# Patient Record
Sex: Male | Born: 1955 | Race: Black or African American | Hispanic: No | Marital: Single | Smoking: Former smoker
Health system: Southern US, Community
[De-identification: ages and names within clinical notes are randomized; demographics above are authoritative.]

## PROBLEM LIST (undated history)

## (undated) DIAGNOSIS — R1310 Dysphagia, unspecified: Secondary | ICD-10-CM

## (undated) DIAGNOSIS — R188 Other ascites: Secondary | ICD-10-CM

## (undated) DIAGNOSIS — R042 Hemoptysis: Secondary | ICD-10-CM

## (undated) DIAGNOSIS — J449 Chronic obstructive pulmonary disease, unspecified: Secondary | ICD-10-CM

## (undated) DIAGNOSIS — I739 Peripheral vascular disease, unspecified: Secondary | ICD-10-CM

## (undated) DIAGNOSIS — I4891 Unspecified atrial fibrillation: Secondary | ICD-10-CM

## (undated) DIAGNOSIS — F0390 Unspecified dementia without behavioral disturbance: Secondary | ICD-10-CM

## (undated) DIAGNOSIS — K219 Gastro-esophageal reflux disease without esophagitis: Secondary | ICD-10-CM

## (undated) DIAGNOSIS — K7460 Unspecified cirrhosis of liver: Secondary | ICD-10-CM

## (undated) DIAGNOSIS — M109 Gout, unspecified: Secondary | ICD-10-CM

## (undated) DIAGNOSIS — E119 Type 2 diabetes mellitus without complications: Secondary | ICD-10-CM

## (undated) DIAGNOSIS — D649 Anemia, unspecified: Secondary | ICD-10-CM

## (undated) DIAGNOSIS — C801 Malignant (primary) neoplasm, unspecified: Secondary | ICD-10-CM

## (undated) DIAGNOSIS — N289 Disorder of kidney and ureter, unspecified: Secondary | ICD-10-CM

## (undated) DIAGNOSIS — I429 Cardiomyopathy, unspecified: Secondary | ICD-10-CM

## (undated) DIAGNOSIS — I272 Pulmonary hypertension, unspecified: Secondary | ICD-10-CM

## (undated) DIAGNOSIS — I509 Heart failure, unspecified: Secondary | ICD-10-CM

## (undated) DIAGNOSIS — E785 Hyperlipidemia, unspecified: Secondary | ICD-10-CM

## (undated) HISTORY — PX: LUNG LOBECTOMY: SHX167

## (undated) HISTORY — PX: VASCULAR SURGERY: SHX849

---

## 2019-09-05 DIAGNOSIS — E162 Hypoglycemia, unspecified: Secondary | ICD-10-CM | POA: Insufficient documentation

## 2019-09-05 DIAGNOSIS — D689 Coagulation defect, unspecified: Secondary | ICD-10-CM | POA: Insufficient documentation

## 2019-09-05 DIAGNOSIS — R52 Pain, unspecified: Secondary | ICD-10-CM | POA: Insufficient documentation

## 2019-09-05 DIAGNOSIS — I429 Cardiomyopathy, unspecified: Secondary | ICD-10-CM | POA: Insufficient documentation

## 2019-09-05 DIAGNOSIS — I059 Rheumatic mitral valve disease, unspecified: Secondary | ICD-10-CM | POA: Insufficient documentation

## 2019-09-05 DIAGNOSIS — E119 Type 2 diabetes mellitus without complications: Secondary | ICD-10-CM | POA: Insufficient documentation

## 2019-09-05 DIAGNOSIS — R000 Tachycardia, unspecified: Secondary | ICD-10-CM | POA: Insufficient documentation

## 2019-09-05 DIAGNOSIS — E875 Hyperkalemia: Secondary | ICD-10-CM | POA: Insufficient documentation

## 2019-09-05 DIAGNOSIS — D631 Anemia in chronic kidney disease: Secondary | ICD-10-CM | POA: Insufficient documentation

## 2019-09-05 DIAGNOSIS — E785 Hyperlipidemia, unspecified: Secondary | ICD-10-CM | POA: Insufficient documentation

## 2019-09-05 DIAGNOSIS — R609 Edema, unspecified: Secondary | ICD-10-CM | POA: Insufficient documentation

## 2019-09-05 DIAGNOSIS — L97909 Non-pressure chronic ulcer of unspecified part of unspecified lower leg with unspecified severity: Secondary | ICD-10-CM | POA: Insufficient documentation

## 2019-09-05 DIAGNOSIS — N2581 Secondary hyperparathyroidism of renal origin: Secondary | ICD-10-CM | POA: Insufficient documentation

## 2019-09-05 DIAGNOSIS — G4733 Obstructive sleep apnea (adult) (pediatric): Secondary | ICD-10-CM | POA: Insufficient documentation

## 2019-09-05 DIAGNOSIS — I5033 Acute on chronic diastolic (congestive) heart failure: Secondary | ICD-10-CM | POA: Insufficient documentation

## 2019-09-05 DIAGNOSIS — R079 Chest pain, unspecified: Secondary | ICD-10-CM | POA: Insufficient documentation

## 2019-09-05 DIAGNOSIS — L299 Pruritus, unspecified: Secondary | ICD-10-CM | POA: Insufficient documentation

## 2019-09-05 DIAGNOSIS — R0602 Shortness of breath: Secondary | ICD-10-CM | POA: Insufficient documentation

## 2019-09-05 DIAGNOSIS — D509 Iron deficiency anemia, unspecified: Secondary | ICD-10-CM | POA: Insufficient documentation

## 2019-09-05 DIAGNOSIS — K219 Gastro-esophageal reflux disease without esophagitis: Secondary | ICD-10-CM | POA: Insufficient documentation

## 2019-09-05 DIAGNOSIS — I10 Essential (primary) hypertension: Secondary | ICD-10-CM | POA: Insufficient documentation

## 2019-09-05 DIAGNOSIS — R188 Other ascites: Secondary | ICD-10-CM | POA: Insufficient documentation

## 2019-09-11 DIAGNOSIS — Z4931 Encounter for adequacy testing for hemodialysis: Secondary | ICD-10-CM | POA: Insufficient documentation

## 2019-09-12 ENCOUNTER — Inpatient Hospital Stay (HOSPITAL_COMMUNITY)
Admission: EM | Admit: 2019-09-12 | Discharge: 2019-09-14 | DRG: 291 | Disposition: A | Discharge status: Skilled Nursing Facility | Payer: Medicare Other | Source: Skilled Nursing Facility | Attending: Internal Medicine | Admitting: Internal Medicine

## 2019-09-12 ENCOUNTER — Encounter (HOSPITAL_COMMUNITY): Payer: Self-pay | Admitting: Emergency Medicine

## 2019-09-12 ENCOUNTER — Emergency Department (HOSPITAL_COMMUNITY): Payer: Medicare Other

## 2019-09-12 ENCOUNTER — Other Ambulatory Visit: Payer: Self-pay

## 2019-09-12 DIAGNOSIS — D631 Anemia in chronic kidney disease: Secondary | ICD-10-CM | POA: Diagnosis present

## 2019-09-12 DIAGNOSIS — J9611 Chronic respiratory failure with hypoxia: Secondary | ICD-10-CM

## 2019-09-12 DIAGNOSIS — K219 Gastro-esophageal reflux disease without esophagitis: Secondary | ICD-10-CM | POA: Diagnosis present

## 2019-09-12 DIAGNOSIS — K7031 Alcoholic cirrhosis of liver with ascites: Secondary | ICD-10-CM | POA: Diagnosis not present

## 2019-09-12 DIAGNOSIS — Z992 Dependence on renal dialysis: Secondary | ICD-10-CM

## 2019-09-12 DIAGNOSIS — M79606 Pain in leg, unspecified: Secondary | ICD-10-CM

## 2019-09-12 DIAGNOSIS — I132 Hypertensive heart and chronic kidney disease with heart failure and with stage 5 chronic kidney disease, or end stage renal disease: Secondary | ICD-10-CM | POA: Diagnosis not present

## 2019-09-12 DIAGNOSIS — Z7189 Other specified counseling: Secondary | ICD-10-CM

## 2019-09-12 DIAGNOSIS — R531 Weakness: Secondary | ICD-10-CM | POA: Diagnosis present

## 2019-09-12 DIAGNOSIS — E611 Iron deficiency: Secondary | ICD-10-CM | POA: Diagnosis present

## 2019-09-12 DIAGNOSIS — Z902 Acquired absence of lung [part of]: Secondary | ICD-10-CM

## 2019-09-12 DIAGNOSIS — R0602 Shortness of breath: Secondary | ICD-10-CM

## 2019-09-12 DIAGNOSIS — I4891 Unspecified atrial fibrillation: Secondary | ICD-10-CM

## 2019-09-12 DIAGNOSIS — Z66 Do not resuscitate: Secondary | ICD-10-CM

## 2019-09-12 DIAGNOSIS — E1122 Type 2 diabetes mellitus with diabetic chronic kidney disease: Secondary | ICD-10-CM | POA: Diagnosis present

## 2019-09-12 DIAGNOSIS — D649 Anemia, unspecified: Secondary | ICD-10-CM | POA: Diagnosis not present

## 2019-09-12 DIAGNOSIS — M109 Gout, unspecified: Secondary | ICD-10-CM | POA: Diagnosis present

## 2019-09-12 DIAGNOSIS — Z20822 Contact with and (suspected) exposure to covid-19: Secondary | ICD-10-CM | POA: Diagnosis present

## 2019-09-12 DIAGNOSIS — Z7901 Long term (current) use of anticoagulants: Secondary | ICD-10-CM

## 2019-09-12 DIAGNOSIS — G4733 Obstructive sleep apnea (adult) (pediatric): Secondary | ICD-10-CM | POA: Diagnosis present

## 2019-09-12 DIAGNOSIS — I951 Orthostatic hypotension: Secondary | ICD-10-CM | POA: Diagnosis present

## 2019-09-12 DIAGNOSIS — R140 Abdominal distension (gaseous): Secondary | ICD-10-CM | POA: Diagnosis present

## 2019-09-12 DIAGNOSIS — M79605 Pain in left leg: Secondary | ICD-10-CM | POA: Diagnosis present

## 2019-09-12 DIAGNOSIS — I509 Heart failure, unspecified: Secondary | ICD-10-CM | POA: Diagnosis present

## 2019-09-12 DIAGNOSIS — N186 End stage renal disease: Secondary | ICD-10-CM | POA: Diagnosis not present

## 2019-09-12 DIAGNOSIS — E1151 Type 2 diabetes mellitus with diabetic peripheral angiopathy without gangrene: Secondary | ICD-10-CM | POA: Diagnosis present

## 2019-09-12 DIAGNOSIS — Z85118 Personal history of other malignant neoplasm of bronchus and lung: Secondary | ICD-10-CM

## 2019-09-12 DIAGNOSIS — Z515 Encounter for palliative care: Secondary | ICD-10-CM

## 2019-09-12 DIAGNOSIS — K7460 Unspecified cirrhosis of liver: Secondary | ICD-10-CM | POA: Diagnosis present

## 2019-09-12 DIAGNOSIS — Z87891 Personal history of nicotine dependence: Secondary | ICD-10-CM

## 2019-09-12 DIAGNOSIS — J449 Chronic obstructive pulmonary disease, unspecified: Secondary | ICD-10-CM | POA: Diagnosis present

## 2019-09-12 DIAGNOSIS — Z79899 Other long term (current) drug therapy: Secondary | ICD-10-CM

## 2019-09-12 DIAGNOSIS — R188 Other ascites: Secondary | ICD-10-CM

## 2019-09-12 DIAGNOSIS — E785 Hyperlipidemia, unspecified: Secondary | ICD-10-CM | POA: Diagnosis present

## 2019-09-12 DIAGNOSIS — Z9981 Dependence on supplemental oxygen: Secondary | ICD-10-CM

## 2019-09-12 HISTORY — DX: Heart failure, unspecified: I50.9

## 2019-09-12 HISTORY — DX: Disorder of kidney and ureter, unspecified: N28.9

## 2019-09-12 HISTORY — DX: Hyperlipidemia, unspecified: E78.5

## 2019-09-12 HISTORY — DX: Unspecified cirrhosis of liver: K74.60

## 2019-09-12 HISTORY — DX: Unspecified atrial fibrillation: I48.91

## 2019-09-12 HISTORY — DX: Pulmonary hypertension, unspecified: I27.20

## 2019-09-12 HISTORY — DX: Malignant (primary) neoplasm, unspecified: C80.1

## 2019-09-12 HISTORY — DX: Peripheral vascular disease, unspecified: I73.9

## 2019-09-12 HISTORY — DX: Chronic obstructive pulmonary disease, unspecified: J44.9

## 2019-09-12 HISTORY — DX: Unspecified dementia, unspecified severity, without behavioral disturbance, psychotic disturbance, mood disturbance, and anxiety: F03.90

## 2019-09-12 HISTORY — DX: Dysphagia, unspecified: R13.10

## 2019-09-12 HISTORY — DX: Anemia, unspecified: D64.9

## 2019-09-12 HISTORY — DX: Hemoptysis: R04.2

## 2019-09-12 HISTORY — DX: Other ascites: R18.8

## 2019-09-12 HISTORY — DX: Type 2 diabetes mellitus without complications: E11.9

## 2019-09-12 HISTORY — DX: Gout, unspecified: M10.9

## 2019-09-12 HISTORY — DX: Gastro-esophageal reflux disease without esophagitis: K21.9

## 2019-09-12 HISTORY — DX: Cardiomyopathy, unspecified: I42.9

## 2019-09-12 LAB — PREPARE RBC (CROSSMATCH)

## 2019-09-12 LAB — IRON AND TIBC
Iron: 15 ug/dL — ABNORMAL LOW (ref 45–182)
Saturation Ratios: 5 % — ABNORMAL LOW (ref 17.9–39.5)
TIBC: 333 ug/dL (ref 250–450)
UIBC: 318 ug/dL

## 2019-09-12 LAB — CBC WITH DIFFERENTIAL/PLATELET
Abs Immature Granulocytes: 0.01 10*3/uL (ref 0.00–0.07)
Basophils Absolute: 0.1 10*3/uL (ref 0.0–0.1)
Basophils Relative: 1 %
Eosinophils Absolute: 0.2 10*3/uL (ref 0.0–0.5)
Eosinophils Relative: 3 %
HCT: 25.1 % — ABNORMAL LOW (ref 39.0–52.0)
Hemoglobin: 7.1 g/dL — ABNORMAL LOW (ref 13.0–17.0)
Immature Granulocytes: 0 %
Lymphocytes Relative: 17 %
Lymphs Abs: 1 10*3/uL (ref 0.7–4.0)
MCH: 26.1 pg (ref 26.0–34.0)
MCHC: 28.3 g/dL — ABNORMAL LOW (ref 30.0–36.0)
MCV: 92.3 fL (ref 80.0–100.0)
Monocytes Absolute: 1 10*3/uL (ref 0.1–1.0)
Monocytes Relative: 17 %
Neutro Abs: 3.8 10*3/uL (ref 1.7–7.7)
Neutrophils Relative %: 62 %
Platelets: 223 10*3/uL (ref 150–400)
RBC: 2.72 MIL/uL — ABNORMAL LOW (ref 4.22–5.81)
RDW: 17 % — ABNORMAL HIGH (ref 11.5–15.5)
WBC: 6 10*3/uL (ref 4.0–10.5)
nRBC: 0 % (ref 0.0–0.2)

## 2019-09-12 LAB — COMPREHENSIVE METABOLIC PANEL
ALT: 19 U/L (ref 0–44)
AST: 51 U/L — ABNORMAL HIGH (ref 15–41)
Albumin: 3.2 g/dL — ABNORMAL LOW (ref 3.5–5.0)
Alkaline Phosphatase: 201 U/L — ABNORMAL HIGH (ref 38–126)
Anion gap: 11 (ref 5–15)
BUN: 30 mg/dL — ABNORMAL HIGH (ref 8–23)
CO2: 31 mmol/L (ref 22–32)
Calcium: 8.5 mg/dL — ABNORMAL LOW (ref 8.9–10.3)
Chloride: 94 mmol/L — ABNORMAL LOW (ref 98–111)
Creatinine, Ser: 6.74 mg/dL — ABNORMAL HIGH (ref 0.61–1.24)
GFR calc Af Amer: 9 mL/min — ABNORMAL LOW (ref >60)
GFR calc non Af Amer: 8 mL/min — ABNORMAL LOW (ref >60)
Glucose, Bld: 154 mg/dL — ABNORMAL HIGH (ref 70–99)
Potassium: 5.2 mmol/L — ABNORMAL HIGH (ref 3.5–5.1)
Sodium: 136 mmol/L (ref 135–145)
Total Bilirubin: 0.7 mg/dL (ref 0.3–1.2)
Total Protein: 6.6 g/dL (ref 6.5–8.1)

## 2019-09-12 LAB — HEPATITIS B SURFACE ANTIGEN: Hepatitis B Surface Ag: NONREACTIVE

## 2019-09-12 LAB — HIV ANTIBODY (ROUTINE TESTING W REFLEX): HIV Screen 4th Generation wRfx: NONREACTIVE

## 2019-09-12 LAB — PROTIME-INR
INR: 2.1 — ABNORMAL HIGH (ref 0.8–1.2)
Prothrombin Time: 23 seconds — ABNORMAL HIGH (ref 11.4–15.2)

## 2019-09-12 LAB — POC OCCULT BLOOD, ED: Fecal Occult Bld: NEGATIVE

## 2019-09-12 LAB — T4, FREE: Free T4: 1.11 ng/dL (ref 0.61–1.12)

## 2019-09-12 LAB — ABO/RH: ABO/RH(D): B POS

## 2019-09-12 LAB — HEMOGLOBIN A1C
Hgb A1c MFr Bld: 6.4 % — ABNORMAL HIGH (ref 4.8–5.6)
Mean Plasma Glucose: 136.98 mg/dL

## 2019-09-12 LAB — FERRITIN: Ferritin: 35 ng/mL (ref 24–336)

## 2019-09-12 LAB — RESPIRATORY PANEL BY RT PCR (FLU A&B, COVID)
Influenza A by PCR: NEGATIVE
Influenza B by PCR: NEGATIVE
SARS Coronavirus 2 by RT PCR: NEGATIVE

## 2019-09-12 LAB — PROCALCITONIN: Procalcitonin: 3.66 ng/mL

## 2019-09-12 LAB — TSH: TSH: 3.494 u[IU]/mL (ref 0.350–4.500)

## 2019-09-12 LAB — LIPASE, BLOOD: Lipase: 149 U/L — ABNORMAL HIGH (ref 11–51)

## 2019-09-12 LAB — GLUCOSE, CAPILLARY
Glucose-Capillary: 103 mg/dL — ABNORMAL HIGH (ref 70–99)
Glucose-Capillary: 86 mg/dL (ref 70–99)

## 2019-09-12 MED ORDER — POLYETHYLENE GLYCOL 3350 17 G PO PACK
17.0000 g | PACK | Freq: Every day | ORAL | Status: DC
  Administered 2019-09-13 – 2019-09-14 (×2): 17 g via ORAL
  Filled 2019-09-12 (×3): qty 1

## 2019-09-12 MED ORDER — ALTEPLASE 2 MG IJ SOLR: 2.0000 mg | Freq: Once | INTRAMUSCULAR | Status: DC | PRN

## 2019-09-12 MED ORDER — SODIUM CHLORIDE 0.9 % IV SOLN: 10.0000 mL/h | Freq: Once | INTRAVENOUS | Status: DC

## 2019-09-12 MED ORDER — INSULIN ASPART 100 UNIT/ML ~~LOC~~ SOLN
0.0000 [IU] | Freq: Three times a day (TID) | SUBCUTANEOUS | Status: DC
  Administered 2019-09-13: 1 [IU] via SUBCUTANEOUS

## 2019-09-12 MED ORDER — DARBEPOETIN ALFA 100 MCG/0.5ML IJ SOSY
100.0000 ug | PREFILLED_SYRINGE | INTRAMUSCULAR | Status: DC
  Filled 2019-09-12: qty 0.5

## 2019-09-12 MED ORDER — AMIODARONE HCL 200 MG PO TABS
100.0000 mg | ORAL_TABLET | Freq: Two times a day (BID) | ORAL | Status: DC
  Administered 2019-09-13 – 2019-09-14 (×4): 100 mg via ORAL
  Filled 2019-09-12 (×5): qty 1

## 2019-09-12 MED ORDER — IPRATROPIUM-ALBUTEROL 0.5-2.5 (3) MG/3ML IN SOLN: 3.0000 mL | Freq: Four times a day (QID) | RESPIRATORY_TRACT | Status: DC | PRN

## 2019-09-12 MED ORDER — HEPARIN SODIUM (PORCINE) 1000 UNIT/ML DIALYSIS: 1000.0000 [IU] | INTRAMUSCULAR | Status: DC | PRN

## 2019-09-12 MED ORDER — SODIUM CHLORIDE 0.9 % IV SOLN: 100.0000 mL | INTRAVENOUS | Status: DC | PRN

## 2019-09-12 MED ORDER — ONDANSETRON HCL 4 MG/2ML IJ SOLN: 4.0000 mg | Freq: Four times a day (QID) | INTRAMUSCULAR | Status: DC | PRN

## 2019-09-12 MED ORDER — CHLORHEXIDINE GLUCONATE CLOTH 2 % EX PADS: 6.0000 | MEDICATED_PAD | Freq: Every day | CUTANEOUS | Status: DC

## 2019-09-12 MED ORDER — MIDODRINE HCL 5 MG PO TABS
5.0000 mg | ORAL_TABLET | Freq: Three times a day (TID) | ORAL | Status: DC
  Administered 2019-09-12 – 2019-09-13 (×2): 5 mg via ORAL
  Filled 2019-09-12 (×2): qty 1

## 2019-09-12 MED ORDER — GABAPENTIN 100 MG PO CAPS
100.0000 mg | ORAL_CAPSULE | Freq: Three times a day (TID) | ORAL | Status: DC
  Administered 2019-09-12 – 2019-09-14 (×7): 100 mg via ORAL
  Filled 2019-09-12 (×7): qty 1

## 2019-09-12 MED ORDER — ACETAMINOPHEN 325 MG PO TABS: 650.0000 mg | ORAL_TABLET | Freq: Four times a day (QID) | ORAL | Status: DC | PRN

## 2019-09-12 MED ORDER — ONDANSETRON HCL 4 MG PO TABS: 4.0000 mg | ORAL_TABLET | Freq: Four times a day (QID) | ORAL | Status: DC | PRN

## 2019-09-12 MED ORDER — APIXABAN 2.5 MG PO TABS
2.5000 mg | ORAL_TABLET | Freq: Two times a day (BID) | ORAL | Status: DC
  Administered 2019-09-13 – 2019-09-14 (×4): 2.5 mg via ORAL
  Filled 2019-09-12 (×5): qty 1

## 2019-09-12 MED ORDER — ACETAMINOPHEN 650 MG RE SUPP: 650.0000 mg | Freq: Four times a day (QID) | RECTAL | Status: DC | PRN

## 2019-09-12 MED ORDER — SODIUM CHLORIDE 0.9% IV SOLUTION: Freq: Once | INTRAVENOUS | Status: DC

## 2019-09-12 MED ORDER — PANTOPRAZOLE SODIUM 40 MG PO TBEC
40.0000 mg | DELAYED_RELEASE_TABLET | Freq: Every day | ORAL | Status: DC
  Administered 2019-09-12 – 2019-09-14 (×3): 40 mg via ORAL
  Filled 2019-09-12 (×3): qty 1

## 2019-09-12 MED ORDER — ALLOPURINOL 100 MG PO TABS
100.0000 mg | ORAL_TABLET | Freq: Every day | ORAL | Status: DC
  Administered 2019-09-12 – 2019-09-14 (×3): 100 mg via ORAL
  Filled 2019-09-12 (×3): qty 1

## 2019-09-12 MED ORDER — INSULIN ASPART 100 UNIT/ML ~~LOC~~ SOLN: 0.0000 [IU] | Freq: Every day | SUBCUTANEOUS | Status: DC

## 2019-09-12 MED ORDER — QUETIAPINE FUMARATE 25 MG PO TABS
25.0000 mg | ORAL_TABLET | Freq: Every day | ORAL | Status: DC
  Administered 2019-09-13 (×2): 25 mg via ORAL
  Filled 2019-09-12 (×4): qty 1

## 2019-09-12 MED ORDER — ATORVASTATIN CALCIUM 20 MG PO TABS
20.0000 mg | ORAL_TABLET | Freq: Every day | ORAL | Status: DC
  Administered 2019-09-12 – 2019-09-13 (×2): 20 mg via ORAL
  Filled 2019-09-12 (×2): qty 1

## 2019-09-12 NOTE — Care Management Obs Status (Signed)
[facility] NOTIFICATION   Patient Details  Name: Andre Smith MRN: [id number] Date of Birth: July 31, 1955   Medicare Observation Status Notification Given:  Yes    Sherie Don, LCSW 09/12/2019, 8:44 PM

## 2019-09-12 NOTE — Plan of Care (Signed)
Pt transported to ultrasound.

## 2019-09-12 NOTE — ED Notes (Signed)
MD number left for nephrology.

## 2019-09-12 NOTE — Progress Notes (Signed)
Received patient from Emergency department transferred to bed. Alert but confused, low blood pressure 78/58, 94/58, SpO2 in 70's on 2L. Trendelenburg position 105/62 and SpO2 saturations 98 on 4L increased oxygen requirements. Dr Tat paged to come evaluate patient. Paracentesis site leaking serous fluid on gown and  Bed/stretcher. Dressing will be reinforced.    Dr Tat returned call and notified of low blood pressures and increased drainage from site.

## 2019-09-12 NOTE — Progress Notes (Signed)
Responded to nursing call:   Patient hypotensive and hypoxic with SaO2 70s.  Subjective: Pt awake and alert without distress.  Patient denies fevers, chills, headache, chest pain, dyspnea, nausea, vomiting, diarrhea, abdominal pain.  Mentation unchanged from earlier today when I examined patient   Vitals:   09/12/19 1500 09/12/19 1600 09/12/19 1642 09/12/19 1644  BP: (!) 112/58 (!) 88/68 (!) 94/58 104/70  Pulse:   83   Resp:   19   Temp:   98.4 F (36.9 C)   TempSrc:   Oral   SpO2:  92% (!) 80% 100%  Height:       CV--RRR Lung--bibasilar crackles Abd--soft+BS/NT   Assessment/Plan: Hypotension??? -I personally placed pt in supine position and repeated bp--108/63--HR 77 -pt normally has soft BPs, chronically on midodrine -has not received PRBC -albumin ordered for HD  Hypoxia?? -pt states his oxygen was off when he arrived on floor when his oxygen was checked -I placed patient on 3L-->>SaO2--98-99% -pt states he is chronically on "3 or 4L"  Paracentesis site drainage -difficult situation -will need reinforced dressing and likely frequent changes     Orson Eva, DO [facility]

## 2019-09-12 NOTE — Plan of Care (Signed)
  Problem: Health Behavior/Discharge Planning: Goal: Ability to manage health-related needs will improve Outcome: Not Progressing   Problem: Activity: Goal: Risk for activity intolerance will decrease Outcome: Not Progressing   

## 2019-09-12 NOTE — ED Triage Notes (Signed)
Pt from [facility]. Pt was supposed to have a paracentesis scheduled. Pt c/o of abdominal distention, sob and left ankle pain.

## 2019-09-12 NOTE — ED Notes (Signed)
DR number left for gastroenterology consult.

## 2019-09-12 NOTE — Progress Notes (Addendum)
Paracentesis stopped due to BP dropping below 90 systolic per PA.

## 2019-09-12 NOTE — Consult Note (Signed)
Andre Smith Admit Date: 09/12/2019 09/12/2019 Rexene Agent Requesting Physician:  Tat MD  Reason for Consult:  ESRD HPI:  4M presented to the ER today with progressive abdominal distention, discomfort, and generalized weakness.  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  The patient was sent from his nursing facility and [facility].  He was having some low blood pressures along with his presenting symptoms.   Work-up in the emergency room identified obvious ascites and he has undergone a 3 L paracentesis since presentation; CT of the chest demonstrated previous left lobectomy, subcentimeter nodule of the right upper lobe.  Labs were notable for BUN 30, creatinine 6.7, potassium 5.2.  Hemoglobin is 7.1 with MCV of 92.  Patient was admitted for anemia, weakness.  At the time of my evaluation he can give me his name, month, but no further identification.  He thinks it is 2004.  He does not know why he is here.  He cannot tell me when he started dialysis.  He does not recall any other kidney history.   ROS Unable to complete a review of 12 systems because of patient's mental status.  PMH  Past Medical History:  Diagnosis Date  . A-fib ([place])   . Anemia   . Cancer ([place])    lung  . Cardiomyopathy ([place])   . CHF (congestive heart failure) ([place])   . Cirrhosis of liver with ascites ([place])   . COPD (chronic obstructive pulmonary disease) ([place])   . Dementia ([place])   . Diabetes mellitus without complication ([place])   . Dysphagia   . GERD (gastroesophageal reflux disease)   . Gout   . Hemoptysis   . Hyperlipemia   . Pulmonary hypertension ([place])   . PVD (peripheral vascular disease) ([place])   . Renal disorder    PSH  Past Surgical History:  Procedure Laterality Date  . LUNG LOBECTOMY    . VASCULAR SURGERY     FH History reviewed. No pertinent family history. SH   reports that he has quit smoking. He does not have any smokeless tobacco history on file. He reports that he does not drink alcohol or use drugs. Allergies  Allergies  Allergen Reactions  . Aspirin     Other reaction(s): increases sugar  . Nickel Rash   Home medications Prior to Admission medications   Medication Sig Start Date End Date Taking? Authorizing Provider  amiodarone (PACERONE) 100 MG tablet Take 1 tablet by mouth 2 (two) times daily. 08/30/19  Yes [provider]  apixaban (ELIQUIS) 5 MG TABS tablet Take 5 mg by mouth 2 (two) times daily.   Yes [provider]  omeprazole (PRILOSEC) 40 MG capsule Take 1 capsule by mouth in the morning. 08/30/19  Yes [provider]  albuterol (VENTOLIN HFA) 108 (90 Base) MCG/ACT inhaler Inhale 2 puffs into the lungs every 4 (four) hours as needed. 08/30/19   [provider]  allopurinol (ZYLOPRIM) 300 MG tablet Take 300 mg by mouth daily. Once daily for  gout 05/11/19   [provider]  atorvastatin (LIPITOR) 20 MG tablet Take 20 mg by mouth daily. 08/30/19   [provider]  gabapentin (NEURONTIN) 300 MG capsule Take 300 mg by mouth 3 (three) times daily. 08/30/19   [provider]  HUMALOG KWIKPEN 100 UNIT/ML KwikPen Inject 0-10 Units into the skin 4 (four) times daily. Before meals and at bedtime  08/30/19   [provider]  metoprolol tartrate (LOPRESSOR) 50 MG tablet Take 50 mg by mouth 2 (two) times daily. 08/30/19   [provider]  midodrine (PROAMATINE) 5 MG tablet Take 1 tablet by mouth with breakfast, with lunch, and with evening meal. Hold for sbp>120, dbp>80 08/30/19   [provider]  MIRALAX 17 g packet Take 1 packet by mouth at bedtime. 08/30/19   [provider]  QUEtiapine (SEROQUEL) 25 MG tablet Take 25 mg by mouth at bedtime. 09/10/19   [provider]    Current Medications Scheduled Meds: . Chlorhexidine Gluconate Cloth  6 each  Topical Q0600   Continuous Infusions: . sodium chloride     PRN Meds:.  CBC Recent Labs  Lab 09/12/19 0742  WBC 6.0  NEUTROABS 3.8  HGB 7.1*  HCT 25.1*  MCV 92.3  PLT 471   Basic Metabolic Panel Recent Labs  Lab 09/12/19 0742  NA 136  K 5.2*  CL 94*  CO2 31  GLUCOSE 154*  BUN 30*  CREATININE 6.74*  CALCIUM 8.5*    Physical Exam  Blood pressure 100/69, pulse 78, temperature 98.1 F (36.7 C), temperature source Oral, resp. rate 20, height 5' 8"  (1.727 m), SpO2 100 %. GEN: Chronically ill-appearing, not a good historian ENT: Poor dentition EYES: EOMI CV: Regular, no rub PULM: Coarse breath sounds bilaterally ABD: Mild distention, bandaging over right lower quadrant with some seeping of straw-colored fluid: Nontender SKIN: Findings of venous stasis with scaling and hyperpigmentation of the lower extremities EXT: 2+ lower extremity edema Right IJ TDC intact, tunnel incision site with suture, appears to have been placed within the past week or 2  Assessment 59B with alcoholic cirrhosis and large volume ascites, ESRD, presenting with anemia, symptomatic ascites, weakness, disorientation.  1. ESRD MWF using TDC in the right IJ in [place] 2. Moderate anemia, for 2 units PRBC with admission, will receive during dialysis 3. Alcoholic cirrhosis with large volume ascites; status post 3 L LVP upon admission 4. History of left pneumonectomy 5. SNF resident 6. Disorientation, unclear what baseline   Plan 1. We will attempt HD today: Transfuse 2 units with treatment, 2K, 3.5h, goal 2.5L UF IVB albumin for IDH 2. Req outpt records 3. Fe studies pending 4. Aranesp 100 with HD today   Rexene Agent  [phone] 09/12/2019, 4:13 PM

## 2019-09-12 NOTE — Procedures (Signed)
   US guided RLQ paracentesis  3 L blood tinged fluid No labs per MD  Tolerated well  EBL: None  BP down to 86 systolic--- stopped procedure at 3L

## 2019-09-12 NOTE — H&P (Signed)
History and Physical  Andre Smith NTI:[id number] DOB: March 14, 1956 DOA: 09/12/2019   PCP: Bonnita Nasuti, MD   Patient coming from: Home  Chief Complaint: abdominal distension, generalized weakness  HPI:  Andre Smith is a 64 y.o. male with medical history of alcoholic liver cirrhosis, ESRD (MWF), OSA, hyperlipidemia, diabetes mellitus type 2, hypertension, COPD presenting with abdominal distention and tightness for the past 2 to 3 days as well as generalized weakness.  Apparently, the patient was noted to have soft blood pressures at his nursing facility, [facility].  Review of the medical record shows that the patient normally has soft blood pressures and takes midodrine.  Apparently, the patient was transported to the hospital in [place] on 09/11/2019.  Unfortunately, paracentesis was not done for unclear reasons, but it was felt to be due to the patient being on anticoagulation.  As result, the patient's symptoms persisted with abdominal tightness.  The patient did have some shortness of breath, but denied any fevers, chills, chest pain, nausea, vomiting, diarrhea, abdominal pain, hematochezia, melena.  Unfortunately, the patient is a poor historian.  He is unable to provide any additional history.  Aside from his chief complaint, he does not know why he is in the hospital. In the emergency department, the patient was afebrile and hemodynamically stable albeit with soft blood pressure of 96/70.  Oxygen saturation was 100% on 4 L.  Chest x-Smith showed vascular congestion.  CT of the chest showed pneumonectomy on the right.  There was a 7 mm nodular density in the right upper lobe with mild groundglass surrounding opacity.  There is a cirrhotic liver with small volume ascites.  There is a small amount of fluid within the left mainstem bronchus some of which is slightly high density and may reflect inspissated secretions.  2 units PRBC was ordered.  Nephrology was consulted  to assist with management.  Paracentesis was performed and 3 L was removed with symptomatic improvement of his abdominal tightness.  Assessment/Plan: Symptomatic anemia -2 units PRBC ordered for transfusion -Patient had hemoglobin of 7.9 on 09/04/2019 -FOBT negative -09/04/2019 serum B12 8676 -1/95/0932 folic acid 67.1 -Check iron studies  Generalized weakness -Likely secondary to symptomatic anemia -Check TSH -Check free T4 -PT evaluation  Alcoholic liver cirrhosis with ascites -Paracentesis requested -GI consult  Atrial fibrillation, type unknown -Presently in sinus rhythm -Continue amiodarone -Continue apixaban  ESRD -Nephrology consulted -Dialysis planned for 09/12/2019  Hyperlipidemia -Continue statin  Diabetes mellitus type 2 -NovoLog sliding scale -09/04/2019 hemoglobin A1c 6.3  History of lung cancer -Status post right pneumonectomy  Left leg pain -venous duplex      Past Medical History:  Diagnosis Date  . A-fib ([place])   . Anemia   . Cancer ([place])    lung  . Cardiomyopathy ([place])   . CHF (congestive heart failure) ([place])   . Cirrhosis of liver with ascites ([place])   . COPD (chronic obstructive pulmonary disease) ([place])   . Dementia ([place])   . Diabetes mellitus without complication ([place])   . Dysphagia   . GERD (gastroesophageal reflux disease)   . Gout   . Hemoptysis   . Hyperlipemia   . Pulmonary hypertension ([place])   . PVD (peripheral vascular disease) ([place])   . Renal disorder    Past Surgical History:  Procedure Laterality Date  . LUNG LOBECTOMY    . VASCULAR SURGERY     Social History:  reports that he has quit smoking. He does not have  any smokeless tobacco history on file. He reports that he does not drink alcohol or use drugs.   History reviewed. No pertinent family history.   Allergies  Allergen Reactions  . Nickel Rash     Prior to Admission medications   Not on File    Review of Systems:  Constitutional:  No weight loss,  night sweats, Fevers, chills, fatigue.  Head&Eyes: No headache.  No vision loss.  No eye pain or scotoma ENT:  No Difficulty swallowing,Tooth/dental problems,Sore throat,  No ear ache, post nasal drip,  Cardio-vascular:  No chest pain, Orthopnea, PND, swelling in lower extremities,  dizziness, palpitations  GI:  No  abdominal pain, nausea, vomiting, diarrhea, loss of appetite, hematochezia, melena Resp:  No shortness of breath with exertion or at rest. No cough. No coughing up of blood .No wheezing.No chest wall deformity  Skin:  no rash or lesions.  GU:  no dysuria, change in color of urine, no urgency or frequency. No flank pain.  Musculoskeletal:  No joint pain or swelling. No decreased range of motion. No back pain.  Psych:  No change in mood or affect.  No headache, no dysesthesia, no focal weakness, no vision loss. No syncope  Physical Exam: Vitals:   09/12/19 0728 09/12/19 0730 09/12/19 1039 09/12/19 1100  BP:  96/70 97/74 (!) 86/61  Pulse:  87  76  Resp:  (!) 21 20   Temp:  98.1 F (36.7 C)    TempSrc:  Oral    SpO2:  100%    Height: 5\' 8"  (1.727 m)      General:  A&O x 3, NAD, nontoxic, pleasant/cooperative Head/Eye: No conjunctival hemorrhage, no icterus, [place]/AT, No nystagmus ENT:  No icterus,  No thrush, good dentition, no pharyngeal exudate Neck:  No masses, no lymphadenpathy, no bruits CV:  RRR, no rub, no gallop, no S3 Lung:  CTAB, good air movement, no wheeze, no rhonchi Abdomen: soft/NT, +BS, nondistended, no peritoneal signs Ext: No cyanosis, No rashes, No petechiae, No lymphangitis, No edema Neuro: CNII-XII intact, strength 4/5 in bilateral upper and lower extremities, no dysmetria  Labs on Admission:  Basic Metabolic Panel: Recent Labs  Lab 09/12/19 0742  NA 136  K 5.2*  CL 94*  CO2 31  GLUCOSE 154*  BUN 30*  CREATININE 6.74*  CALCIUM 8.5*   Liver Function Tests: Recent Labs  Lab 09/12/19 0742  AST 51*  ALT 19  ALKPHOS 201*  BILITOT  0.7  PROT 6.6  ALBUMIN 3.2*   Recent Labs  Lab 09/12/19 0742  LIPASE 149*   No results for input(s): AMMONIA in the last 168 hours. CBC: Recent Labs  Lab 09/12/19 0742  WBC 6.0  NEUTROABS 3.8  HGB 7.1*  HCT 25.1*  MCV 92.3  PLT 223   Coagulation Profile: Recent Labs  Lab 09/12/19 0742  INR 2.1*   Cardiac Enzymes: No results for input(s): CKTOTAL, CKMB, CKMBINDEX, TROPONINI in the last 168 hours. BNP: Invalid input(s): POCBNP CBG: No results for input(s): GLUCAP in the last 168 hours. Urine analysis: No results found for: COLORURINE, APPEARANCEUR, LABSPEC, PHURINE, GLUCOSEU, HGBUR, BILIRUBINUR, KETONESUR, PROTEINUR, UROBILINOGEN, NITRITE, LEUKOCYTESUR Sepsis Labs: @LABRCNTIP (procalcitonin:4,lacticidven:4) ) Recent Results (from the past 240 hour(s))  Respiratory Panel by RT PCR (Flu A&B, Covid) - Nasopharyngeal Swab     Status: None   Collection Time: 09/12/19  7:42 AM   Specimen: Nasopharyngeal Swab  Result Value Ref Range Status   SARS Coronavirus 2 by RT PCR NEGATIVE NEGATIVE Final  Comment: (NOTE) SARS-CoV-2 target nucleic acids are NOT DETECTED. The SARS-CoV-2 RNA is generally detectable in upper respiratoy specimens during the acute phase of infection. The lowest concentration of SARS-CoV-2 viral copies this assay can detect is 131 copies/mL. A negative result does not preclude SARS-Cov-2 infection and should not be used as the sole basis for treatment or other patient management decisions. A negative result may occur with  improper specimen collection/handling, submission of specimen other than nasopharyngeal swab, presence of viral mutation(s) within the areas targeted by this assay, and inadequate number of viral copies (<131 copies/mL). A negative result must be combined with clinical observations, patient history, and epidemiological information. The expected result is Negative. Fact Sheet for Patients:   PinkCheek.be Fact Sheet for Healthcare Providers:  GravelBags.it This test is not yet ap proved or cleared by the [place] FDA and  has been authorized for detection and/or diagnosis of SARS-CoV-2 by FDA under an Emergency Use Authorization (EUA). This EUA will remain  in effect (meaning this test can be used) for the duration of the COVID-19 declaration under Section 564(b)(1) of the Act, 21 U.S.C. section 360bbb-3(b)(1), unless the authorization is terminated or revoked sooner.    Influenza A by PCR NEGATIVE NEGATIVE Final   Influenza B by PCR NEGATIVE NEGATIVE Final    Comment: (NOTE) The Xpert Xpress SARS-CoV-2/FLU/RSV assay is intended as an aid in  the diagnosis of influenza from Nasopharyngeal swab specimens and  should not be used as a sole basis for treatment. Nasal washings and  aspirates are unacceptable for Xpert Xpress SARS-CoV-2/FLU/RSV  testing. Fact Sheet for Patients: PinkCheek.be Fact Sheet for Healthcare Providers: GravelBags.it This test is not yet approved or cleared by the [place] FDA and  has been authorized for detection and/or diagnosis of SARS-CoV-2 by  FDA under an Emergency Use Authorization (EUA). This EUA will remain  in effect (meaning this test can be used) for the duration of the  Covid-19 declaration under Section 564(b)(1) of the Act, 21  U.S.C. section 360bbb-3(b)(1), unless the authorization is  terminated or revoked. Performed at [facility], [address], [place], [place] [address]      Radiological Exams on Admission: CT Chest Wo Contrast  Result Date: 09/12/2019 CLINICAL DATA:  Shortness of breath. Prior left lung surgery EXAM: CT CHEST WITHOUT CONTRAST TECHNIQUE: Multidetector CT imaging of the chest was performed following the standard protocol without IV contrast. COMPARISON:  09/12/2019 FINDINGS:  Cardiovascular: Mild cardiomegaly. No pericardial effusion. Extensive coronary artery calcifications. Nonaneurysmal thoracic aorta with scattered atherosclerotic calcification. Mediastinum/Nodes: No axillary, mediastinal, or hilar lymphadenopathy. Mediastinal structures are shifted to the left with postsurgical volume loss of the left hemithorax. 1.9 cm right thyroid lobe nodule. Esophagus unremarkable. There is fluid within the left mainstem bronchus. Lungs/Pleura: Postsurgical changes of left-sided pneumonectomy with expected appearance of the left pleural space. 7 mm nodular density within the right upper lobe with mild surrounding ground-glass (series 4, image 50). Dependent atelectasis within the right lower lobe. No pleural effusion or pneumothorax. Upper Abdomen: Shrunken, nodular appearance of the liver suggesting cirrhosis. Small volume upper abdominal ascites. Musculoskeletal: Postsurgical changes of posterior left ribs related to pneumonectomy. No acute osseous findings. Degenerative changes of the lower cervical spine. Bilateral gynecomastia. IMPRESSION: 1. Postsurgical changes of left-sided pneumonectomy. 2. Small amount of fluid within the left mainstem bronchus, some of which is slightly high density and may reflect inspissated secretions. Trachea and right bronchus are clear. 3. 7 mm nodular density within the right upper  lobe. Non-contrast chest CT at 6-12 months is recommended. If the nodule is stable at time of repeat CT, then future CT at 18-24 months (from today's scan) is considered optional for low-risk patients, but is recommended for high-risk patients. This recommendation follows the consensus statement: Guidelines for Management of Incidental Pulmonary Nodules Detected on CT Images: From the Fleischner Society 2017; Radiology 2017; 284:228-243. 4. Shrunken, nodular appearance of the liver suggesting cirrhosis. Small volume upper abdominal ascites. 5. 1.9 cm right thyroid lobe nodule.  Recommend non emergent thyroid US. (Ref: J Am Coll Radiol. 2015 Feb;12(2): 143-50). 6. Coronary artery atherosclerosis. Aortic Atherosclerosis (ICD10-I70.0). Electronically Signed   By: Davina Poke D.O.   On: 09/12/2019 09:48   DG Chest Portable 1 View  Result Date: 09/12/2019 CLINICAL DATA:  Shortness of breath EXAM: PORTABLE CHEST 1 VIEW COMPARISON:  None. FINDINGS: Complete opacification of the left chest with volume loss. Mild congestion of right-sided vessels. Heart size is largely obscured. Dialysis catheter with tip at the upper cavoatrial junction. Remote left structure chronic left rib deformities. IMPRESSION: 1. White out of the left chest with postoperative changes. There is history of left lobectomy, question left pneumonectomy instead. If there is remaining left lung, would recommend CT to evaluate for lobar collapse/airway obstruction. 2. Vascular congestion in the right lung. Electronically Signed   By: Monte Fantasia M.D.   On: 09/12/2019 08:07    EKG: Independently reviewed. Sinus, nonspecific TWI    Time spent:60 minutes Code Status:   FULL Family Communication:  No Family at bedside Disposition Plan: expect 1-2 day hospitalization Consults called: renal DVT Prophylaxis:apixaban  Orson Eva, DO  [facility] Pager [phone]  If 7PM-7AM, please contact night-coverage [email or web address] Password [facility] 09/12/2019, 11:06 AM

## 2019-09-12 NOTE — ED Provider Notes (Signed)
Emergency Department Provider Note   I have reviewed the triage vital signs and the nursing notes.   HISTORY  Chief Complaint Abdominal Pain   HPI Andre Smith is a 64 y.o. 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  Patient has a draining area to the left leg which he states been sore for several days.  The [facility] has been applying a dressing and he denies any fevers.  Patient has regular large volume paracentesis performed and states that he is due to have fluid drawn off.  He was taken from the [facility] to [place] yesterday but for an unknown reason the procedure was not performed and he was transported back home to the [facility].  He is not experiencing fever or abdominal pain.  He is experiencing mild shortness of breath symptoms without chest pain.  Denies any leg swelling.  No radiation of symptoms or other modifying factors.  Past Medical History:  Diagnosis Date  . A-fib ([place])   . Anemia   . Cancer ([place])    lung  . Cardiomyopathy ([place])   . CHF (congestive heart failure) ([place])   . Cirrhosis of liver with ascites ([place])   . COPD (chronic obstructive pulmonary disease) ([place])   . Dementia ([place])   . Diabetes mellitus without complication ([place])   . Dysphagia   . GERD (gastroesophageal reflux disease)   . Gout   . Hemoptysis   . Hyperlipemia   . Pulmonary hypertension ([place])   . PVD (peripheral vascular disease) ([place])   . Renal disorder     Patient Active Problem List   Diagnosis Date Noted  . Symptomatic anemia 09/12/2019  . ESRD (end stage renal disease) ([place]) 09/12/2019  . Unspecified atrial fibrillation ([place]) 09/12/2019  . Alcoholic cirrhosis of liver with ascites ([place]) 09/12/2019    Past Surgical History:  Procedure Laterality Date  . LUNG LOBECTOMY    .  VASCULAR SURGERY      Allergies Nickel  History reviewed. No pertinent family history.  Social History Social History   Tobacco Use  . Smoking status: Former Smoker  Substance Use Topics  . Alcohol use: Never  . Drug use: Never    Review of Systems  Constitutional: No fever/chills Eyes: No visual changes. ENT: No sore throat. Cardiovascular: Denies chest pain. Respiratory: Positive shortness of breath. Gastrointestinal: No abdominal pain.  No nausea, no vomiting.  No diarrhea.  No constipation. Positive abdominal distension and tightness.  Genitourinary: Negative for dysuria. Musculoskeletal: Negative for back pain. Skin: Negative for rash. Neurological: Negative for headaches, focal weakness or numbness.  10-point ROS otherwise negative.  ____________________________________________   PHYSICAL EXAM:  VITAL SIGNS: ED Triage Vitals  Enc Vitals Group     BP 09/12/19 0730 96/70     Pulse Rate 09/12/19 0730 87     Resp 09/12/19 0730 (!) 21     Temp 09/12/19 0730 98.1 F (36.7 C)     Temp Source 09/12/19 0730 Oral     SpO2 09/12/19 0730 100 %     Weight --      Height 09/12/19 0728 5\' 8"  (1.727 m)   Constitutional: Alert and oriented. Well appearing and in no acute distress. Eyes: Conjunctivae are normal. Head: Atraumatic. Nose: No congestion/rhinnorhea. Mouth/Throat: Mucous membranes are moist.  Neck: No stridor.  Cardiovascular: Normal rate, regular  rhythm. Good peripheral circulation. Grossly normal heart sounds. Well-appearing HD cath in the left chest.  Respiratory: Normal respiratory effort.  No retractions. Lungs CTAB. Gastrointestinal: Soft and nontender. Abdomen is tense and distended.  Musculoskeletal: No lower extremity tenderness with trace pitting edema. No gross deformities of extremities. Neurologic:  Normal speech and language. No gross focal neurologic deficits are appreciated.  Skin:  Skin is warm and dry. Shallow area of skin breakdown over  the left anterior LE. No cellulitis or abscess. No purulent drainage.   ____________________________________________   LABS (all labs ordered are listed, but only abnormal results are displayed)  Labs Reviewed  COMPREHENSIVE METABOLIC PANEL - Abnormal; Notable for the following components:      Result Value   Potassium 5.2 (*)    Chloride 94 (*)    Glucose, Bld 154 (*)    BUN 30 (*)    Creatinine, Ser 6.74 (*)    Calcium 8.5 (*)    Albumin 3.2 (*)    AST 51 (*)    Alkaline Phosphatase 201 (*)    GFR calc non Af Amer 8 (*)    GFR calc Af Amer 9 (*)    All other components within normal limits  CBC WITH DIFFERENTIAL/PLATELET - Abnormal; Notable for the following components:   RBC 2.72 (*)    Hemoglobin 7.1 (*)    HCT 25.1 (*)    MCHC 28.3 (*)    RDW 17.0 (*)    All other components within normal limits  LIPASE, BLOOD - Abnormal; Notable for the following components:   Lipase 149 (*)    All other components within normal limits  PROTIME-INR - Abnormal; Notable for the following components:   Prothrombin Time 23.0 (*)    INR 2.1 (*)    All other components within normal limits  RESPIRATORY PANEL BY RT PCR (FLU A&B, COVID)  POC OCCULT BLOOD, ED  PREPARE RBC (CROSSMATCH)  TYPE AND SCREEN   ____________________________________________  EKG   EKG Interpretation  Date/Time:  Wednesday September 12 2019 07:27:09 EDT Ventricular Rate:  89 PR Interval:    QRS Duration: 124 QT Interval:  385 QTC Calculation: 469 R Axis:   -22 Text Interpretation: Sinus rhythm Nonspecific intraventricular conduction delay Nonspecific T abnormalities, lateral leads No STEMI Confirmed by Nanda Quinton [phone]) on 09/12/2019 7:37:23 AM       ____________________________________________  RADIOLOGY  CT Chest Wo Contrast  Result Date: 09/12/2019 CLINICAL DATA:  Shortness of breath. Prior left lung surgery EXAM: CT CHEST WITHOUT CONTRAST TECHNIQUE: Multidetector CT imaging of the chest was  performed following the standard protocol without IV contrast. COMPARISON:  09/12/2019 FINDINGS: Cardiovascular: Mild cardiomegaly. No pericardial effusion. Extensive coronary artery calcifications. Nonaneurysmal thoracic aorta with scattered atherosclerotic calcification. Mediastinum/Nodes: No axillary, mediastinal, or hilar lymphadenopathy. Mediastinal structures are shifted to the left with postsurgical volume loss of the left hemithorax. 1.9 cm right thyroid lobe nodule. Esophagus unremarkable. There is fluid within the left mainstem bronchus. Lungs/Pleura: Postsurgical changes of left-sided pneumonectomy with expected appearance of the left pleural space. 7 mm nodular density within the right upper lobe with mild surrounding ground-glass (series 4, image 50). Dependent atelectasis within the right lower lobe. No pleural effusion or pneumothorax. Upper Abdomen: Shrunken, nodular appearance of the liver suggesting cirrhosis. Small volume upper abdominal ascites. Musculoskeletal: Postsurgical changes of posterior left ribs related to pneumonectomy. No acute osseous findings. Degenerative changes of the lower cervical spine. Bilateral gynecomastia. IMPRESSION: 1. Postsurgical changes of left-sided pneumonectomy. 2. Small  amount of fluid within the left mainstem bronchus, some of which is slightly high density and may reflect inspissated secretions. Trachea and right bronchus are clear. 3. 7 mm nodular density within the right upper lobe. Non-contrast chest CT at 6-12 months is recommended. If the nodule is stable at time of repeat CT, then future CT at 18-24 months (from today's scan) is considered optional for low-risk patients, but is recommended for high-risk patients. This recommendation follows the consensus statement: Guidelines for Management of Incidental Pulmonary Nodules Detected on CT Images: From the Fleischner Society 2017; Radiology 2017; 284:228-243. 4. Shrunken, nodular appearance of the liver  suggesting cirrhosis. Small volume upper abdominal ascites. 5. 1.9 cm right thyroid lobe nodule. Recommend non emergent thyroid US. (Ref: J Am Coll Radiol. 2015 Feb;12(2): 143-50). 6. Coronary artery atherosclerosis. Aortic Atherosclerosis (ICD10-I70.0). Electronically Signed   By: Davina Poke D.O.   On: 09/12/2019 09:48   US Paracentesis  Result Date: 09/12/2019 INDICATION: Ascites EXAM: ULTRASOUND GUIDED PARACENTESIS MEDICATIONS: 10 cc 1% lidocaine COMPLICATIONS: None immediate. PROCEDURE: Informed written consent was obtained from the patient after a discussion of the risks, benefits and alternatives to treatment. A timeout was performed prior to the initiation of the procedure. Initial ultrasound scanning demonstrates a large amount of ascites within the right lower abdominal quadrant. The right lower abdomen was prepped and draped in the usual sterile fashion. 1% lidocaine was used for local anesthesia. Following this, a 76 G Yueh catheter was introduced. An ultrasound image was saved for documentation purposes. The paracentesis was performed. The catheter was removed and a dressing was applied. The patient tolerated the procedure well without immediate post procedural complication. Patient received post-procedure intravenous albumin; see nursing notes for details. FINDINGS: A total of approximately 3 liters of blood tinged fluid was removed. IMPRESSION: Successful ultrasound-guided paracentesis yielding 3 liters of peritoneal fluid. Read by Lavonia Drafts [facility] Electronically Signed   By: Markus Daft M.D.   On: 09/12/2019 11:24   DG Chest Portable 1 View  Result Date: 09/12/2019 CLINICAL DATA:  Shortness of breath EXAM: PORTABLE CHEST 1 VIEW COMPARISON:  None. FINDINGS: Complete opacification of the left chest with volume loss. Mild congestion of right-sided vessels. Heart size is largely obscured. Dialysis catheter with tip at the upper cavoatrial junction. Remote left structure chronic left rib  deformities. IMPRESSION: 1. White out of the left chest with postoperative changes. There is history of left lobectomy, question left pneumonectomy instead. If there is remaining left lung, would recommend CT to evaluate for lobar collapse/airway obstruction. 2. Vascular congestion in the right lung. Electronically Signed   By: Monte Fantasia M.D.   On: 09/12/2019 08:07    ____________________________________________   PROCEDURES  Procedure(s) performed:   Procedures  CRITICAL CARE Performed by: Margette Fast Total critical care time: 35 minutes Critical care time was exclusive of separately billable procedures and treating other patients. Critical care was necessary to treat or prevent imminent or life-threatening deterioration. Critical care was time spent personally by me on the following activities: development of treatment plan with patient and/or surrogate as well as nursing, discussions with consultants, evaluation of patient's response to treatment, examination of patient, obtaining history from patient or surrogate, ordering and performing treatments and interventions, ordering and review of laboratory studies, ordering and review of radiographic studies, pulse oximetry and re-evaluation of patient's condition.  Nanda Quinton, MD Emergency Medicine  ____________________________________________   INITIAL IMPRESSION / ASSESSMENT AND PLAN / ED COURSE  Pertinent labs & imaging  results that were available during my care of the patient were reviewed by me and considered in my medical decision making (see chart for details).   Patient presents to the emergency department for evaluation of abdominal distention with need for large-volume paracentesis.  No concern clinically for SBP.  Patient is in no acute respiratory distress.  He is on his baseline home oxygen.  His left ankle has a small area of skin breakdown without sign of infection.  Plan for wound care to this area at [facility]  and PCP follow-up for this.  We will try and facilitate LVP today.   08:10 AM  Hb of 7.1 and INR at 2.1. No baseline values for comparison. In review of home med list the patient is on Xarelto. Attempted to call [facility] for additional information with no answer.   08:35 AM  Rectal exam performed with patient consent and nurse tech chaperone.  Brown stool noted. No gross blood or melena.   09:23 AM  Was able to get in touch with nursing supervisor at [facility] in [place].  She tells me that the patient was sent to the hospital in [facility] yesterday for LVP.  They reported that they could not do this procedure because the patient is anticoagulated.  He was also found to be hypotensive which they thought was baseline but staff at [facility] state that his pressures are normally in the 287G systolic.  Also found to have some hypoxemia.  When the morning staff arrived they sent the patient back out to the ED given the low blood pressure and continued abdominal distention.  Last dialysis was Monday.   10:30 AM  Spoke with Dr. Joelyn Oms.  Agrees with plan for dialysis today with blood transfusion during dialysis. Will admit to [facility]. Paracentesis in process.   Discussed patient's case with [facility], Dr. Carles Collet to request admission. Patient and family (if present) updated with plan. Care transferred to [facility] service.  I reviewed all nursing notes, vitals, pertinent old records, EKGs, labs, imaging (as available).  ____________________________________________  FINAL CLINICAL IMPRESSION(S) / ED DIAGNOSES  Final diagnoses:  Ascites of liver  Symptomatic anemia  SOB (shortness of breath)     MEDICATIONS GIVEN DURING THIS VISIT:  Medications  0.9 %  sodium chloride infusion (has no administration in time range)  Chlorhexidine Gluconate Cloth 2 % PADS 6 each (has no administration in time range)     Note:  This document was prepared using Dragon voice recognition software and may  include unintentional dictation errors.  Nanda Quinton, MD, [facility] Emergency Medicine    Charman Blasco, [facility], MD 09/12/19 [phone]

## 2019-09-12 NOTE — Procedures (Signed)
    HEMODIALYSIS TREATMENT NOTE:  3.5 hour heparin-free treatment completed via right IJ tunneled catheter. Exit site unremarkable.     Treatment fluid balance:  NS +700cc pRBC +610 UF -2850 _______________  NET UF = 1540cc  BP improved after transfusion. Otherwise, no changes from pre-treatment assessment.  Rockwell Alexandria, RN

## 2019-09-13 ENCOUNTER — Encounter (HOSPITAL_COMMUNITY): Payer: Self-pay | Admitting: Internal Medicine

## 2019-09-13 ENCOUNTER — Observation Stay (HOSPITAL_COMMUNITY): Payer: Medicare Other

## 2019-09-13 DIAGNOSIS — J9611 Chronic respiratory failure with hypoxia: Secondary | ICD-10-CM | POA: Diagnosis present

## 2019-09-13 DIAGNOSIS — K7460 Unspecified cirrhosis of liver: Secondary | ICD-10-CM

## 2019-09-13 DIAGNOSIS — N186 End stage renal disease: Secondary | ICD-10-CM | POA: Diagnosis not present

## 2019-09-13 DIAGNOSIS — D649 Anemia, unspecified: Secondary | ICD-10-CM | POA: Diagnosis not present

## 2019-09-13 DIAGNOSIS — J449 Chronic obstructive pulmonary disease, unspecified: Secondary | ICD-10-CM | POA: Diagnosis present

## 2019-09-13 DIAGNOSIS — Z66 Do not resuscitate: Secondary | ICD-10-CM

## 2019-09-13 DIAGNOSIS — Z992 Dependence on renal dialysis: Secondary | ICD-10-CM | POA: Diagnosis not present

## 2019-09-13 DIAGNOSIS — R531 Weakness: Secondary | ICD-10-CM | POA: Diagnosis present

## 2019-09-13 DIAGNOSIS — Z7189 Other specified counseling: Secondary | ICD-10-CM

## 2019-09-13 DIAGNOSIS — G4733 Obstructive sleep apnea (adult) (pediatric): Secondary | ICD-10-CM | POA: Diagnosis present

## 2019-09-13 DIAGNOSIS — R0602 Shortness of breath: Secondary | ICD-10-CM | POA: Diagnosis present

## 2019-09-13 DIAGNOSIS — I132 Hypertensive heart and chronic kidney disease with heart failure and with stage 5 chronic kidney disease, or end stage renal disease: Secondary | ICD-10-CM | POA: Diagnosis present

## 2019-09-13 DIAGNOSIS — M79605 Pain in left leg: Secondary | ICD-10-CM | POA: Diagnosis present

## 2019-09-13 DIAGNOSIS — I509 Heart failure, unspecified: Secondary | ICD-10-CM | POA: Diagnosis present

## 2019-09-13 DIAGNOSIS — R188 Other ascites: Secondary | ICD-10-CM

## 2019-09-13 DIAGNOSIS — Z902 Acquired absence of lung [part of]: Secondary | ICD-10-CM | POA: Diagnosis not present

## 2019-09-13 DIAGNOSIS — I4891 Unspecified atrial fibrillation: Secondary | ICD-10-CM | POA: Diagnosis present

## 2019-09-13 DIAGNOSIS — R140 Abdominal distension (gaseous): Secondary | ICD-10-CM | POA: Diagnosis present

## 2019-09-13 DIAGNOSIS — E1122 Type 2 diabetes mellitus with diabetic chronic kidney disease: Secondary | ICD-10-CM | POA: Diagnosis present

## 2019-09-13 DIAGNOSIS — K7031 Alcoholic cirrhosis of liver with ascites: Secondary | ICD-10-CM | POA: Diagnosis present

## 2019-09-13 DIAGNOSIS — M109 Gout, unspecified: Secondary | ICD-10-CM | POA: Diagnosis present

## 2019-09-13 DIAGNOSIS — K219 Gastro-esophageal reflux disease without esophagitis: Secondary | ICD-10-CM | POA: Diagnosis present

## 2019-09-13 DIAGNOSIS — E611 Iron deficiency: Secondary | ICD-10-CM | POA: Diagnosis present

## 2019-09-13 DIAGNOSIS — I951 Orthostatic hypotension: Secondary | ICD-10-CM | POA: Diagnosis present

## 2019-09-13 DIAGNOSIS — Z7901 Long term (current) use of anticoagulants: Secondary | ICD-10-CM | POA: Diagnosis not present

## 2019-09-13 DIAGNOSIS — E785 Hyperlipidemia, unspecified: Secondary | ICD-10-CM | POA: Diagnosis present

## 2019-09-13 DIAGNOSIS — Z20822 Contact with and (suspected) exposure to covid-19: Secondary | ICD-10-CM | POA: Diagnosis present

## 2019-09-13 DIAGNOSIS — D631 Anemia in chronic kidney disease: Secondary | ICD-10-CM | POA: Diagnosis present

## 2019-09-13 DIAGNOSIS — E1151 Type 2 diabetes mellitus with diabetic peripheral angiopathy without gangrene: Secondary | ICD-10-CM | POA: Diagnosis present

## 2019-09-13 DIAGNOSIS — Z515 Encounter for palliative care: Secondary | ICD-10-CM

## 2019-09-13 LAB — TYPE AND SCREEN
ABO/RH(D): B POS
Antibody Screen: NEGATIVE
Unit division: 0
Unit division: 0

## 2019-09-13 LAB — CBC
HCT: 28.9 % — ABNORMAL LOW (ref 39.0–52.0)
Hemoglobin: 8.5 g/dL — ABNORMAL LOW (ref 13.0–17.0)
MCH: 25.9 pg — ABNORMAL LOW (ref 26.0–34.0)
MCHC: 29.4 g/dL — ABNORMAL LOW (ref 30.0–36.0)
MCV: 88.1 fL (ref 80.0–100.0)
Platelets: 197 10*3/uL (ref 150–400)
RBC: 3.28 MIL/uL — ABNORMAL LOW (ref 4.22–5.81)
RDW: 16.5 % — ABNORMAL HIGH (ref 11.5–15.5)
WBC: 5 10*3/uL (ref 4.0–10.5)
nRBC: 0 % (ref 0.0–0.2)

## 2019-09-13 LAB — GLUCOSE, CAPILLARY
Glucose-Capillary: 89 mg/dL (ref 70–99)
Glucose-Capillary: 154 mg/dL — ABNORMAL HIGH (ref 70–99)
Glucose-Capillary: 108 mg/dL — ABNORMAL HIGH (ref 70–99)
Glucose-Capillary: 96 mg/dL (ref 70–99)

## 2019-09-13 LAB — BPAM RBC
Blood Product Expiration Date: 202104172359
Blood Product Expiration Date: 202104222359
ISSUE DATE / TIME: 202103241824
ISSUE DATE / TIME: 202103241905
Unit Type and Rh: 1700
Unit Type and Rh: 1700

## 2019-09-13 LAB — CORTISOL-AM, BLOOD: Cortisol - AM: 11 ug/dL (ref 6.7–22.6)

## 2019-09-13 MED ORDER — SODIUM CHLORIDE 0.9 % IV SOLN
250.0000 mg | Freq: Every day | INTRAVENOUS | Status: AC
  Administered 2019-09-13 – 2019-09-14 (×2): 250 mg via INTRAVENOUS
  Filled 2019-09-13 (×2): qty 20

## 2019-09-13 MED ORDER — CHLORHEXIDINE GLUCONATE CLOTH 2 % EX PADS
6.0000 | MEDICATED_PAD | Freq: Every day | CUTANEOUS | Status: DC
  Administered 2019-09-14: 6 via TOPICAL

## 2019-09-13 MED ORDER — MIDODRINE HCL 5 MG PO TABS
10.0000 mg | ORAL_TABLET | Freq: Three times a day (TID) | ORAL | Status: DC
  Administered 2019-09-13 – 2019-09-14 (×5): 10 mg via ORAL
  Filled 2019-09-13 (×4): qty 2

## 2019-09-13 NOTE — Progress Notes (Signed)
PROGRESS NOTE  Andre Smith KCL:[id number] DOB: 12-29-1955 DOA: 09/12/2019 PCP: Bonnita Nasuti, MD  Brief History:   64 y.o. male with medical history of alcoholic liver cirrhosis, ESRD (MWF), OSA, hyperlipidemia, diabetes mellitus type 2, hypertension, COPD presenting with abdominal distention and tightness for the past 2 to 3 days as well as generalized weakness.  Apparently, the patient was noted to have soft blood pressures at his nursing facility, [facility].  Review of the medical record shows that the patient normally has soft blood pressures and takes midodrine.  Apparently, the patient was transported to the hospital in [place] on 09/11/2019.  Unfortunately, paracentesis was not done for unclear reasons, but it was felt to be due to the patient being on anticoagulation.  As result, the patient's symptoms persisted with abdominal tightness.  The patient did have some shortness of breath, but denied any fevers, chills, chest pain, nausea, vomiting, diarrhea, abdominal pain, hematochezia, melena.  Unfortunately, the patient is a poor historian.  In the emergency department, the patient was afebrile and hemodynamically stable albeit with soft blood pressure of 96/70.  Oxygen saturation was 100% on 4 L.  Chest x-ray showed vascular congestion.  CT of the chest showed pneumonectomy on the right.  There was a 7 mm nodular density in the right upper lobe with mild groundglass surrounding opacity.  There is a cirrhotic liver with small volume ascites.  There is a small amount of fluid within the left mainstem bronchus some of which is slightly high density and may reflect inspissated secretions.  2 units PRBC was ordered.  Nephrology was consulted to assist with management.  Paracentesis was performed and 3 L was removed with symptomatic improvement of his abdominal tightness.  Assessment/Plan: Symptomatic anemia -2 units PRBC transfused -Patient had hemoglobin of 7.9  on 09/04/2019 -FOBT negative -09/04/2019 serum C94--4967 -5/91/6384 folic acid 66.5 -Check iron studies--iron sat 5%, ferritin 35  Generalized weakness -Likely secondary to symptomatic anemia -Check TSH--3.494 -Check free T4--1.11 -PT evaluation-->SNF  Decompensated Alcoholic liver cirrhosis with ascites -3/24--Paracentesis--3L removed -GI consult appreciated--no further work up presently -no further drainage from from paracentesis site  Atrial fibrillation, type unknown -Presently in sinus rhythm -Continue amiodarone -Continue apixaban  ESRD -Nephrology consulted -Dialysis done on 3/24  Orthostatic Hypotension -increase midodrine to 10 mg tid -pt has soft BPs at baseline  Hyperlipidemia -Continue statin  Diabetes mellitus type 2 -NovoLog sliding scale -09/04/2019 hemoglobin A1c 6.3 -CBGs controlled  History of lung cancer -Status post right pneumonectomy  Left leg pain -venous duplex--neg  Goals of Care -met with palliative medicine--goals of care discussed -changed to DNR        Disposition Plan: Patient From: SNF D/C Place: SNF - 09/14/19 if stable Barriers:  Family Communication:  no Family at bedside  Consultants:  none  Code Status:  DNR  DVT Prophylaxis:  apixaban   Procedures: As Listed in Progress Note Above  Antibiotics: None       Subjective: Patient denies fevers, chills, headache, chest pain, dyspnea, nausea, vomiting, diarrhea, abdominal pain, dysuria, hematuria, hematochezia, and melena.   Objective: Vitals:   09/13/19 0619 09/13/19 0743 09/13/19 1000 09/13/19 1420  BP: (!) 90/58 94/62 99/65 91/67  Pulse:   81 91  Resp:   17 20  Temp:   98.2 F (36.8 C) (!) 97.1 F (36.2 C)  TempSrc:   Oral   SpO2:   98% 98%  Weight:  Height:        Intake/Output Summary (Last 24 hours) at 09/13/2019 1548 Last data filed at 09/13/2019 1544 Gross per 24 hour  Intake 1190 ml  Output 1540 ml  Net -350 ml    Weight change:  Exam:   General:  Pt is alert, follows commands appropriately, not in acute distress  HEENT: No icterus, No thrush, No neck mass, Angus/AT  Cardiovascular: RRR, S1/S2, no rubs, no gallops  Respiratory: bibasilar crackles. No wheeze  Abdomen: Soft/+BS, non tender, non distended, no guarding  Extremities: trace LE edema, No lymphangitis, No petechiae, No rashes, no synovitis   Data Reviewed: I have personally reviewed following labs and imaging studies Basic Metabolic Panel: Recent Labs  Lab 09/12/19 0742  NA 136  K 5.2*  CL 94*  CO2 31  GLUCOSE 154*  BUN 30*  CREATININE 6.74*  CALCIUM 8.5*   Liver Function Tests: Recent Labs  Lab 09/12/19 0742  AST 51*  ALT 19  ALKPHOS 201*  BILITOT 0.7  PROT 6.6  ALBUMIN 3.2*   Recent Labs  Lab 09/12/19 0742  LIPASE 149*   No results for input(s): AMMONIA in the last 168 hours. Coagulation Profile: Recent Labs  Lab 09/12/19 0742  INR 2.1*   CBC: Recent Labs  Lab 09/12/19 0742 09/13/19 0516  WBC 6.0 5.0  NEUTROABS 3.8  --   HGB 7.1* 8.5*  HCT 25.1* 28.9*  MCV 92.3 88.1  PLT 223 197   Cardiac Enzymes: No results for input(s): CKTOTAL, CKMB, CKMBINDEX, TROPONINI in the last 168 hours. BNP: Invalid input(s): POCBNP CBG: Recent Labs  Lab 09/12/19 1756 09/12/19 2133 09/13/19 0754 09/13/19 1123  GLUCAP 103* 86 89 154*   HbA1C: Recent Labs    09/12/19 1745  HGBA1C 6.4*   Urine analysis: No results found for: COLORURINE, APPEARANCEUR, LABSPEC, PHURINE, GLUCOSEU, HGBUR, BILIRUBINUR, KETONESUR, PROTEINUR, UROBILINOGEN, NITRITE, LEUKOCYTESUR Sepsis Labs: _0 (procalcitonin:4,lacticidven:4) ) Recent Results (from the past 240 hour(s))  Respiratory Panel by RT PCR (Flu A&B, Covid) - Nasopharyngeal Swab     Status: None   Collection Time: 09/12/19  7:42 AM   Specimen: Nasopharyngeal Swab  Result Value Ref Range Status   SARS Coronavirus 2 by RT PCR NEGATIVE NEGATIVE Final     Comment: (NOTE) SARS-CoV-2 target nucleic acids are NOT DETECTED. The SARS-CoV-2 RNA is generally detectable in upper respiratoy specimens during the acute phase of infection. The lowest concentration of SARS-CoV-2 viral copies this assay can detect is 131 copies/mL. A negative result does not preclude SARS-Cov-2 infection and should not be used as the sole basis for treatment or other patient management decisions. A negative result may occur with  improper specimen collection/handling, submission of specimen other than nasopharyngeal swab, presence of viral mutation(s) within the areas targeted by this assay, and inadequate number of viral copies (<131 copies/mL). A negative result must be combined with clinical observations, patient history, and epidemiological information. The expected result is Negative. Fact Sheet for Patients:  PinkCheek.be Fact Sheet for Healthcare Providers:  GravelBags.it This test is not yet ap proved or cleared by the [place] FDA and  has been authorized for detection and/or diagnosis of SARS-CoV-2 by FDA under an Emergency Use Authorization (EUA). This EUA will remain  in effect (meaning this test can be used) for the duration of the COVID-19 declaration under Section 564(b)(1) of the Act, 21 U.S.C. section 360bbb-3(b)(1), unless the authorization is terminated or revoked sooner.    Influenza A by PCR NEGATIVE NEGATIVE Final  Influenza B by PCR NEGATIVE NEGATIVE Final    Comment: (NOTE) The Xpert Xpress SARS-CoV-2/FLU/RSV assay is intended as an aid in  the diagnosis of influenza from Nasopharyngeal swab specimens and  should not be used as a sole basis for treatment. Nasal washings and  aspirates are unacceptable for Xpert Xpress SARS-CoV-2/FLU/RSV  testing. Fact Sheet for Patients: PinkCheek.be Fact Sheet for Healthcare  Providers: GravelBags.it This test is not yet approved or cleared by the [place] FDA and  has been authorized for detection and/or diagnosis of SARS-CoV-2 by  FDA under an Emergency Use Authorization (EUA). This EUA will remain  in effect (meaning this test can be used) for the duration of the  Covid-19 declaration under Section 564(b)(1) of the Act, 21  U.S.C. section 360bbb-3(b)(1), unless the authorization is  terminated or revoked. Performed at [facility], [address], [place], [place] [address]   Culture, blood (Routine X 2) w Reflex to ID Panel     Status: None (Preliminary result)   Collection Time: 09/12/19  5:51 PM   Specimen: BLOOD  Result Value Ref Range Status   Specimen Description BLOOD DRAWN BY DIALYSIS PIC LINE  Final   Special Requests   Final    BOTTLES DRAWN AEROBIC AND ANAEROBIC Blood Culture adequate volume   Culture   Final    NO GROWTH < 24 HOURS Performed at [facility], [address] [place], [place] [address]    Report Status PENDING  Incomplete  Culture, blood (Routine X 2) w Reflex to ID Panel     Status: None (Preliminary result)   Collection Time: 09/12/19  5:52 PM   Specimen: BLOOD  Result Value Ref Range Status   Specimen Description BLOOD PIC LINE DRAWN BY DIALYSIS  Final   Special Requests   Final    BOTTLES DRAWN AEROBIC AND ANAEROBIC Blood Culture adequate volume   Culture   Final    NO GROWTH < 24 HOURS Performed at [facility], [address], [place], [place] [address]    Report Status PENDING  Incomplete     Scheduled Meds: . sodium chloride   Intravenous Once  . allopurinol  100 mg Oral Daily  . amiodarone  100 mg Oral BID  . apixaban  2.5 mg Oral BID  . atorvastatin  20 mg Oral q1800  . Chlorhexidine Gluconate Cloth  6 each Topical Q0600  . Chlorhexidine Gluconate Cloth  6 each Topical Q0600  . darbepoetin (ARANESP) injection - DIALYSIS  100 mcg Intravenous Q Wed-HD  . gabapentin  100 mg  Oral TID  . insulin aspart  0-5 Units Subcutaneous QHS  . insulin aspart  0-6 Units Subcutaneous TID WC  . midodrine  10 mg Oral TID WC  . pantoprazole  40 mg Oral Daily  . polyethylene glycol  17 g Oral Daily  . QUEtiapine  25 mg Oral QHS   Continuous Infusions: . sodium chloride    . sodium chloride    . sodium chloride    . ferric gluconate (FERRLECIT/NULECIT) IV Stopped (09/13/19 1428)    Procedures/Studies: CT Chest Wo Contrast  Result Date: 09/12/2019 CLINICAL DATA:  Shortness of breath. Prior left lung surgery EXAM: CT CHEST WITHOUT CONTRAST TECHNIQUE: Multidetector CT imaging of the chest was performed following the standard protocol without IV contrast. COMPARISON:  09/12/2019 FINDINGS: Cardiovascular: Mild cardiomegaly. No pericardial effusion. Extensive coronary artery calcifications. Nonaneurysmal thoracic aorta with scattered atherosclerotic calcification. Mediastinum/Nodes: No axillary, mediastinal, or hilar lymphadenopathy. Mediastinal structures are  shifted to the left with postsurgical volume loss of the left hemithorax. 1.9 cm right thyroid lobe nodule. Esophagus unremarkable. There is fluid within the left mainstem bronchus. Lungs/Pleura: Postsurgical changes of left-sided pneumonectomy with expected appearance of the left pleural space. 7 mm nodular density within the right upper lobe with mild surrounding ground-glass (series 4, image 50). Dependent atelectasis within the right lower lobe. No pleural effusion or pneumothorax. Upper Abdomen: Shrunken, nodular appearance of the liver suggesting cirrhosis. Small volume upper abdominal ascites. Musculoskeletal: Postsurgical changes of posterior left ribs related to pneumonectomy. No acute osseous findings. Degenerative changes of the lower cervical spine. Bilateral gynecomastia. IMPRESSION: 1. Postsurgical changes of left-sided pneumonectomy. 2. Small amount of fluid within the left mainstem bronchus, some of which is slightly high  density and may reflect inspissated secretions. Trachea and right bronchus are clear. 3. 7 mm nodular density within the right upper lobe. Non-contrast chest CT at 6-12 months is recommended. If the nodule is stable at time of repeat CT, then future CT at 18-24 months (from today's scan) is considered optional for low-risk patients, but is recommended for high-risk patients. This recommendation follows the consensus statement: Guidelines for Management of Incidental Pulmonary Nodules Detected on CT Images: From the Fleischner Society 2017; Radiology 2017; 284:228-243. 4. Shrunken, nodular appearance of the liver suggesting cirrhosis. Small volume upper abdominal ascites. 5. 1.9 cm right thyroid lobe nodule. Recommend non emergent thyroid US. (Ref: J Am Coll Radiol. 2015 Feb;12(2): 143-50). 6. Coronary artery atherosclerosis. Aortic Atherosclerosis (ICD10-I70.0). Electronically Signed   By: Davina Poke D.O.   On: 09/12/2019 09:48   US Venous Img Lower Unilateral Left (DVT)  Result Date: 09/13/2019 CLINICAL DATA:  LEFT lower extremity pain EXAM: LEFT LOWER EXTREMITY VENOUS DOPPLER ULTRASOUND TECHNIQUE: Gray-scale sonography with compression, as well as color and duplex ultrasound, were performed to evaluate the deep venous system(s) from the level of the common femoral vein through the popliteal and proximal calf veins. COMPARISON:  None FINDINGS: VENOUS Normal compressibility of the common femoral, superficial femoral, and popliteal veins, as well as the visualized calf veins. Visualized portions of profunda femoral vein and great saphenous vein unremarkable. No filling defects to suggest DVT on grayscale or color Doppler imaging. Doppler waveforms show normal direction of venous flow, normal respiratory phasicity and response to augmentation. Limited views of the contralateral common femoral vein are unremarkable. OTHER None. Limitations: none IMPRESSION: No femoropopliteal DVT nor evidence of DVT within  the visualized calf veins in the LEFT leg. If clinical symptoms are inconsistent or if there are persistent or worsening symptoms, further imaging (possibly involving the iliac veins) may be warranted. Electronically Signed   By: Lavonia Dana M.D.   On: 09/13/2019 14:35   US Paracentesis  Result Date: 09/12/2019 INDICATION: Ascites EXAM: ULTRASOUND GUIDED PARACENTESIS MEDICATIONS: 10 cc 1% lidocaine COMPLICATIONS: None immediate. PROCEDURE: Informed written consent was obtained from the patient after a discussion of the risks, benefits and alternatives to treatment. A timeout was performed prior to the initiation of the procedure. Initial ultrasound scanning demonstrates a large amount of ascites within the right lower abdominal quadrant. The right lower abdomen was prepped and draped in the usual sterile fashion. 1% lidocaine was used for local anesthesia. Following this, a 106 G Yueh catheter was introduced. An ultrasound image was saved for documentation purposes. The paracentesis was performed. The catheter was removed and a dressing was applied. The patient tolerated the procedure well without immediate post procedural complication. Patient received post-procedure intravenous albumin;  see nursing notes for details. FINDINGS: A total of approximately 3 liters of blood tinged fluid was removed. IMPRESSION: Successful ultrasound-guided paracentesis yielding 3 liters of peritoneal fluid. Read by Lavonia Drafts [facility] Electronically Signed   By: Markus Daft M.D.   On: 09/12/2019 11:24   DG Chest Portable 1 View  Result Date: 09/12/2019 CLINICAL DATA:  Shortness of breath EXAM: PORTABLE CHEST 1 VIEW COMPARISON:  None. FINDINGS: Complete opacification of the left chest with volume loss. Mild congestion of right-sided vessels. Heart size is largely obscured. Dialysis catheter with tip at the upper cavoatrial junction. Remote left structure chronic left rib deformities. IMPRESSION: 1. White out of the left chest with  postoperative changes. There is history of left lobectomy, question left pneumonectomy instead. If there is remaining left lung, would recommend CT to evaluate for lobar collapse/airway obstruction. 2. Vascular congestion in the right lung. Electronically Signed   By: Monte Fantasia M.D.   On: 09/12/2019 08:07    Orson Eva, DO  [facility]  If 7PM-7AM, please contact night-coverage [email or web address] Password [facility] 09/13/2019, 3:48 PM   LOS: 0 days

## 2019-09-13 NOTE — Consult Note (Signed)
Referring Provider: Orson Eva, MD Primary Care Physician:  Bonnita Nasuti, MD Primary Gastroenterologist:  Previously unassigned, Dr. Gala Romney  Reason for Consultation:  cirrhosis  HPI: Andre Smith is a 64 y.o. male with past medical history of alcoholic liver disease, end-stage renal disease on hemodialysis, diabetes, hypertension, COPD, CHF, cardiomyopathy, history of lung cancer status post resection presenting from skilled nursing facility with abdominal distention and generalized weakness.  Patient is a difficult historian.  Majority of history obtained from the chart.  Reportedly patient has soft blood pressures, takes Midodrine.  He was transported to hospital in [place] on March 23 for abdominal paracentesis, per medical records paracentesis not performed because patient was on anticoagulation.  Patient reports that he has had several paracentesis in the past.  He states abdominal tightness worsen causing some shortness of breath.  Brought to the ED for abdominal distention, hypotension.  Per nursing staff at the [facility] his systolics are usually in the 140 range and found him to be hypotensive and hypoxic.  In the ED hemoglobin 7.1, INR 2.1.  Rectal exam with brown stool, heme-negative. Blood pressure was 96/70, O2 sats 100% on 4 L, chest x-ray showed vascular congestion.  CT of the chest showed pneumonectomy on the right.  7 mm nodular density in the right upper lobe with mild groundglass surrounding opacity.  Small amount of fluid within the left mainstem bronchus some of which is slightly high density and may reflect inspissated secretions.  Paracentesis was performed with 3 L of blood-tinged fluid removed.  Fluid not sent for analysis.  In the ED potassium 5.2, BUN 30, creatinine 6.74, albumin 3.2, AST 51, ALT 19, alk phos 201, total bilirubin 0.7, hemoglobin 7.1, platelets 223,000, lipase 149, INR 2.1, heme-negative stool, iron 15, iron saturations 5%, TIBC 333, ferritin 35.   Today hemoglobin 8.5.  A.m. cortisol level normal.  With soft blood pressures patient has been placed in Trendelenburg position over the last 24 hours.  He has also been having issues with leaking serous fluid from his paracentesis site.  His blood pressures this morning with systolic in the 66-44 range.  O2 sats 96%.  Pulse in the 70s to 80s range.  Patient denies abdominal pain.  States his bowel movements are fine.  He denies any blood in the stool.  Appetite is good.  No nausea or vomiting.  Has no significant complaints but again he is a poor historian.  Nursing staff reported large brown bowel movement last night, [place] 2.   Prior to Admission medications   Medication Sig Start Date End Date Taking? Authorizing Provider  albuterol (VENTOLIN HFA) 108 (90 Base) MCG/ACT inhaler Inhale 2 puffs into the lungs every 4 (four) hours as needed. 08/30/19  Yes [provider]  allopurinol (ZYLOPRIM) 300 MG tablet Take 300 mg by mouth daily. Once daily for  gout 05/11/19  Yes [provider]  amiodarone (PACERONE) 100 MG tablet Take 1 tablet by mouth 2 (two) times daily. 08/30/19  Yes [provider]  apixaban (ELIQUIS) 5 MG TABS tablet Take 5 mg by mouth 2 (two) times daily.   Yes [provider]  atorvastatin (LIPITOR) 20 MG tablet Take 20 mg by mouth at bedtime.  08/30/19  Yes [provider]  gabapentin (NEURONTIN) 300 MG capsule Take 300 mg by mouth 3 (three) times daily. 08/30/19  Yes [provider]  HUMALOG KWIKPEN 100 UNIT/ML KwikPen Inject 0-10 Units into the skin 4 (four) times daily. Before meals and  at bedtime 08/30/19  Yes [provider]  metoprolol tartrate (LOPRESSOR) 50 MG tablet Take 50 mg by mouth 2 (two) times daily. 08/30/19  Yes [provider]  midodrine (PROAMATINE) 5 MG tablet Take 1 tablet by mouth with breakfast, with lunch, and with evening meal. Hold for sbp>120, dbp>80 08/30/19  Yes [provider]   MIRALAX 17 g packet Take 1 packet by mouth at bedtime. 08/30/19  Yes [provider]  omeprazole (PRILOSEC) 40 MG capsule Take 1 capsule by mouth in the morning. 08/30/19  Yes [provider]  QUEtiapine (SEROQUEL) 25 MG tablet Take 25 mg by mouth at bedtime. 09/10/19  Yes [provider]    Current Facility-Administered Medications  Medication Dose Route Frequency Provider Last Rate Last Admin  . 0.9 %  sodium chloride infusion (Manually program via Guardrails IV Fluids)   Intravenous Once Pearson Grippe B, MD      . 0.9 %  sodium chloride infusion  10 mL/hr Intravenous Once Long, Wonda Olds, MD      . 0.9 %  sodium chloride infusion  100 mL Intravenous PRN Pearson Grippe B, MD      . 0.9 %  sodium chloride infusion  100 mL Intravenous PRN Pearson Grippe B, MD      . acetaminophen (TYLENOL) tablet 650 mg  650 mg Oral Q6H PRN Tat, Shanon Brow, MD       Or  . acetaminophen (TYLENOL) suppository 650 mg  650 mg Rectal Q6H PRN Tat, Shanon Brow, MD      . allopurinol (ZYLOPRIM) tablet 100 mg  100 mg Oral Daily Tat, Shanon Brow, MD   100 mg at 09/12/19 1803  . alteplase (CATHFLO ACTIVASE) injection 2 mg  2 mg Intracatheter Once PRN Pearson Grippe B, MD      . amiodarone (PACERONE) tablet 100 mg  100 mg Oral BID Orson Eva, MD   100 mg at 09/13/19 0024  . apixaban (ELIQUIS) tablet 2.5 mg  2.5 mg Oral BID Tat, Shanon Brow, MD   2.5 mg at 09/13/19 0023  . atorvastatin (LIPITOR) tablet 20 mg  20 mg Oral q1800 Orson Eva, MD   20 mg at 09/12/19 1758  . Chlorhexidine Gluconate Cloth 2 % PADS 6 each  6 each Topical Q0600 Rexene Agent, MD      . Darbepoetin Alfa (ARANESP) injection 100 mcg  100 mcg Intravenous Q Wed-HD Pearson Grippe B, MD      . gabapentin (NEURONTIN) capsule 100 mg  100 mg Oral TID Orson Eva, MD   100 mg at 09/13/19 0024  . heparin injection 1,000 Units  1,000 Units Dialysis PRN Rexene Agent, MD      . insulin aspart (novoLOG) injection 0-5 Units  0-5 Units Subcutaneous QHS Tat, Shanon Brow,  MD      . insulin aspart (novoLOG) injection 0-6 Units  0-6 Units Subcutaneous TID WC Tat, David, MD      . ipratropium-albuterol (DUONEB) 0.5-2.5 (3) MG/3ML nebulizer solution 3 mL  3 mL Nebulization Q6H PRN Tat, Shanon Brow, MD      . midodrine (PROAMATINE) tablet 5 mg  5 mg Oral TID WC Orson Eva, MD   5 mg at 09/12/19 1758  . ondansetron (ZOFRAN) tablet 4 mg  4 mg Oral Q6H PRN Tat, David, MD       Or  . ondansetron (ZOFRAN) injection 4 mg  4 mg Intravenous Q6H PRN Tat, David, MD      . pantoprazole (PROTONIX) EC tablet 40  mg  40 mg Oral Daily Tat, David, MD   40 mg at 09/12/19 1758  . polyethylene glycol (MIRALAX / GLYCOLAX) packet 17 g  17 g Oral Daily Tat, David, MD      . QUEtiapine (SEROQUEL) tablet 25 mg  25 mg Oral Benay Pike, MD   25 mg at 09/13/19 0025    Allergies as of 09/12/2019 - Review Complete 09/12/2019  Allergen Reaction Noted  . Aspirin  09/12/2019  . Nickel Rash 09/12/2019    Past Medical History:  Diagnosis Date  . A-fib ([place])   . Anemia   . Cancer ([place])    lung  . Cardiomyopathy ([place])   . CHF (congestive heart failure) ([place])   . Cirrhosis of liver with ascites ([place])   . COPD (chronic obstructive pulmonary disease) ([place])   . Dementia ([place])   . Diabetes mellitus without complication ([place])   . Dysphagia   . GERD (gastroesophageal reflux disease)   . Gout   . Hemoptysis   . Hyperlipemia   . Pulmonary hypertension ([place])   . PVD (peripheral vascular disease) ([place])   . Renal disorder     Past Surgical History:  Procedure Laterality Date  . LUNG LOBECTOMY    . VASCULAR SURGERY      History reviewed. No pertinent family history.  Social History   Socioeconomic History  . Marital status: Single    Spouse name: Not on file  . Number of children: Not on file  . Years of education: Not on file  . Highest education level: Not on file  Occupational History  . Not on file  Tobacco Use  . Smoking status: Former Smoker  Substance and Sexual Activity  .  Alcohol use: Never  . Drug use: Never  . Sexual activity: Not on file  Other Topics Concern  . Not on file  Social History Narrative  . Not on file   Social Determinants of Health   Financial Resource Strain:   . Difficulty of Paying Living Expenses:   Food Insecurity:   . Worried About Charity fundraiser in the Last Year:   . Arboriculturist in the Last Year:   Transportation Needs:   . Film/video editor (Medical):   Marland Kitchen Lack of Transportation (Non-Medical):   Physical Activity:   . Days of Exercise per Week:   . Minutes of Exercise per Session:   Stress:   . Feeling of Stress :   Social Connections:   . Frequency of Communication with Friends and Family:   . Frequency of Social Gatherings with Friends and Family:   . Attends Religious Services:   . Active Member of Clubs or Organizations:   . Attends Archivist Meetings:   Marland Kitchen Marital Status:   Intimate Partner Violence:   . Fear of Current or Ex-Partner:   . Emotionally Abused:   Marland Kitchen Physically Abused:   . Sexually Abused:      ROS: Patient difficult historian, denies any complaint General: Negative for anorexia, weight loss, fever, chills, fatigue, weakness. Eyes: Negative for vision changes.  ENT: Negative for hoarseness, difficulty swallowing , nasal congestion. CV: Negative for chest pain, angina, palpitations, dyspnea on exertion, peripheral edema.  Respiratory: Negative for dyspnea at rest, dyspnea on exertion, cough, sputum, wheezing.  GI: See history of present illness. GU:  Negative for dysuria, hematuria, urinary incontinence, urinary frequency, nocturnal urination.  MS: Negative for joint pain, low back pain.  Derm: Negative for  rash or itching.  Neuro: Negative for weakness, abnormal sensation, seizure, frequent headaches, memory loss, confusion.  Psych: Negative for anxiety, depression, suicidal ideation, hallucinations.  Endo: Negative for unusual weight change.  Heme: Negative for bruising  or bleeding. Allergy: Negative for rash or hives.       Physical Examination: Vital signs in last 24 hours: Temp:  [98 F (36.7 C)-99 F (37.2 C)] 98.7 F (37.1 C) (03/25 0536) Pulse Rate:  [65-88] 88 (03/25 0536) Resp:  [16-20] 18 (03/25 0536) BP: (82-112)/(51-74) 94/62 (03/25 0743) SpO2:  [80 %-100 %] 96 % (03/25 0536) Weight:  [94 kg] 94 kg (03/24 1720) Last BM Date: 09/11/19  General: Appears older than stated age, no acute distress sitting up eating breakfast. Head: Normocephalic, atraumatic.   Eyes: Conjunctiva pink, no icterus. Mouth: Oropharyngeal mucosa moist and pink , no lesions erythema or exudate. Neck: Supple without thyromegaly, masses, or lymphadenopathy.  Lungs: Clear to auscultation bilaterally.  Heart: Regular rate and rhythm, no murmurs rubs or gallops.  Abdomen: Bowel sounds are normal, nontender, distended but not tense, no hepatosplenomegaly or masses, no abdominal bruits or    hernia , no rebound or guarding.  Large bandage over paracentesis site, dry Rectal: Not performed Extremities: 2+ pitting edema  lower extremity.  No clubbing, deformity.  Neuro: Alert and oriented x 4 , grossly normal neurologically.  Skin: Warm and dry, no rash or jaundice.   Psych: Alert and cooperative, normal mood and affect.        Intake/Output from previous day: 03/24 0701 - 03/25 0700 In: 610 [Blood:610] Out: 1540  Intake/Output this shift: No intake/output data recorded.  Lab Results: CBC Recent Labs    09/12/19 0742 09/13/19 0516  WBC 6.0 5.0  HGB 7.1* 8.5*  HCT 25.1* 28.9*  MCV 92.3 88.1  PLT 223 197   BMET Recent Labs    09/12/19 0742  NA 136  K 5.2*  CL 94*  CO2 31  GLUCOSE 154*  BUN 30*  CREATININE 6.74*  CALCIUM 8.5*   LFT Recent Labs    09/12/19 0742  BILITOT 0.7  ALKPHOS 201*  AST 51*  ALT 19  PROT 6.6  ALBUMIN 3.2*    Lipase Recent Labs    09/12/19 0742  LIPASE 149*    PT/INR Recent Labs    09/12/19 0742  LABPROT  23.0*  INR 2.1*      Imaging Studies: CT Chest Wo Contrast  Result Date: 09/12/2019 CLINICAL DATA:  Shortness of breath. Prior left lung surgery EXAM: CT CHEST WITHOUT CONTRAST TECHNIQUE: Multidetector CT imaging of the chest was performed following the standard protocol without IV contrast. COMPARISON:  09/12/2019 FINDINGS: Cardiovascular: Mild cardiomegaly. No pericardial effusion. Extensive coronary artery calcifications. Nonaneurysmal thoracic aorta with scattered atherosclerotic calcification. Mediastinum/Nodes: No axillary, mediastinal, or hilar lymphadenopathy. Mediastinal structures are shifted to the left with postsurgical volume loss of the left hemithorax. 1.9 cm right thyroid lobe nodule. Esophagus unremarkable. There is fluid within the left mainstem bronchus. Lungs/Pleura: Postsurgical changes of left-sided pneumonectomy with expected appearance of the left pleural space. 7 mm nodular density within the right upper lobe with mild surrounding ground-glass (series 4, image 50). Dependent atelectasis within the right lower lobe. No pleural effusion or pneumothorax. Upper Abdomen: Shrunken, nodular appearance of the liver suggesting cirrhosis. Small volume upper abdominal ascites. Musculoskeletal: Postsurgical changes of posterior left ribs related to pneumonectomy. No acute osseous findings. Degenerative changes of the lower cervical spine. Bilateral gynecomastia. IMPRESSION: 1. Postsurgical changes of  left-sided pneumonectomy. 2. Small amount of fluid within the left mainstem bronchus, some of which is slightly high density and may reflect inspissated secretions. Trachea and right bronchus are clear. 3. 7 mm nodular density within the right upper lobe. Non-contrast chest CT at 6-12 months is recommended. If the nodule is stable at time of repeat CT, then future CT at 18-24 months (from today's scan) is considered optional for low-risk patients, but is recommended for high-risk patients. This  recommendation follows the consensus statement: Guidelines for Management of Incidental Pulmonary Nodules Detected on CT Images: From the Fleischner Society 2017; Radiology 2017; 284:228-243. 4. Shrunken, nodular appearance of the liver suggesting cirrhosis. Small volume upper abdominal ascites. 5. 1.9 cm right thyroid lobe nodule. Recommend non emergent thyroid US. (Ref: J Am Coll Radiol. 2015 Feb;12(2): 143-50). 6. Coronary artery atherosclerosis. Aortic Atherosclerosis (ICD10-I70.0). Electronically Signed   By: Davina Poke D.O.   On: 09/12/2019 09:48   US Paracentesis  Result Date: 09/12/2019 INDICATION: Ascites EXAM: ULTRASOUND GUIDED PARACENTESIS MEDICATIONS: 10 cc 1% lidocaine COMPLICATIONS: None immediate. PROCEDURE: Informed written consent was obtained from the patient after a discussion of the risks, benefits and alternatives to treatment. A timeout was performed prior to the initiation of the procedure. Initial ultrasound scanning demonstrates a large amount of ascites within the right lower abdominal quadrant. The right lower abdomen was prepped and draped in the usual sterile fashion. 1% lidocaine was used for local anesthesia. Following this, a 1 G Yueh catheter was introduced. An ultrasound image was saved for documentation purposes. The paracentesis was performed. The catheter was removed and a dressing was applied. The patient tolerated the procedure well without immediate post procedural complication. Patient received post-procedure intravenous albumin; see nursing notes for details. FINDINGS: A total of approximately 3 liters of blood tinged fluid was removed. IMPRESSION: Successful ultrasound-guided paracentesis yielding 3 liters of peritoneal fluid. Read by Lavonia Drafts [facility] Electronically Signed   By: Markus Daft M.D.   On: 09/12/2019 11:24   DG Chest Portable 1 View  Result Date: 09/12/2019 CLINICAL DATA:  Shortness of breath EXAM: PORTABLE CHEST 1 VIEW COMPARISON:  None.  FINDINGS: Complete opacification of the left chest with volume loss. Mild congestion of right-sided vessels. Heart size is largely obscured. Dialysis catheter with tip at the upper cavoatrial junction. Remote left structure chronic left rib deformities. IMPRESSION: 1. White out of the left chest with postoperative changes. There is history of left lobectomy, question left pneumonectomy instead. If there is remaining left lung, would recommend CT to evaluate for lobar collapse/airway obstruction. 2. Vascular congestion in the right lung. Electronically Signed   By: Monte Fantasia M.D.   On: 09/12/2019 08:07  [4 week]   Impression: 64 year old gentleman, difficult historian, with history of end-stage renal disease, cirrhosis presenting with abdominal distention, hypotension.  Chronically anticoagulated for A. Fib.  Apparently patient receives abdominal paracenteses as needed.  Recently transported to hospital in [place] but paracentesis not done reportedly because of chronic anticoagulation.  Anemia: In the setting of cirrhosis, end-stage renal disease.  No overt GI bleeding this admission.  Hemoccult negative.  Iron low, ferritin low normal likely with some iron deficiency contributing to the picture.  Hemoglobin up posttransfusion.  Continue to monitor.  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  Paracentesis completed as outlined.  He has had some leakage at the site which is not unusual.  Continue to apply pressure dressing. It is unclear if he has a hepatologist that he is following  with. Agree with palliative input for goals of treatment.   Plan: 1. Monitor hemoglobin for any significant decline. 2. If any evidence of bloody drainage from paracentesis site, would recommend holding Eliquis for 1-2 days. Spoke with nurse, Janett Billow. no evidence of persistent drainage from para site today. She will notify GI if any changes.   3. Agree with palliative input for goals of treatment.   We  would like to thank you for the opportunity to participate in the care of [employer].  Laureen Ochs. Bernarda Caffey [facility] [phone] 3/25/202110:33 AM     LOS: 0 days

## 2019-09-13 NOTE — Progress Notes (Signed)
Dressing change to Right lower abdomen due to leakage from paracentesis site. Removed old dressing that was dated 3/24 at 1700. Dressing was heavily soiled with serosanguinous drainage. Cleaned the area around the incision and reapplied clean gauze reinforced by 3 abd pads and tape. Placed additional chucks under the pt side to prevent the bed from being soiled again. Pt tolerated well

## 2019-09-13 NOTE — Progress Notes (Signed)
[facility] NEPHROLOGY PROGRESS NOTE  Assessment/ Plan: Pt is a 64 y.o. yo male with history of alcoholic liver disease with cirrhosis, ascites, ESRD on HD at [facility], DM, HTN, OSA sent from nursing home for abdominal distention and shortness of breath.  # ESRD MWF at [facility]: Status post HD yesterday with net 1.5 L removal.  Plan for next HD tomorrow.  Right IJ TDC for the access  # Anemia due to ESRD ? Bleed as patient is on anticoagulation: Received 2 units of blood transfusion on admission.  Iron saturation 5%.  I will start IV iron and continue ESA. Monitor HD  #Hypotension/volume: Complicated by ascites.  Increased midodrine to 10 mg 3 times daily.  Monitor BP.  #CKD-MBD: Check phosphorus level.  #Alcoholic liver cirrhosis with large volume ascites is status post paracentesis with 3 L fluid removal on 3/24.  Subjective: Seen and examined at bedside.  Noted patient was hypotensive.  Reports some dizziness denied chest pain or shortness of breath.  No nausea or vomiting.  Had dialysis yesterday. Objective Vital signs in last 24 hours: Vitals:   09/13/19 0536 09/13/19 0539 09/13/19 0619 09/13/19 0743  BP: (!) 82/57 (!) 86/58 (!) 90/58 94/62  Pulse: 88     Resp: 18     Temp: 98.7 F (37.1 C)     TempSrc: Oral     SpO2: 96%     Weight:      Height:       Weight change:   Intake/Output Summary (Last 24 hours) at 09/13/2019 1027 Last data filed at 09/13/2019 0900 Gross per 24 hour  Intake 850 ml  Output 1540 ml  Net -690 ml       Labs: Basic Metabolic Panel: Recent Labs  Lab 09/12/19 0742  NA 136  K 5.2*  CL 94*  CO2 31  GLUCOSE 154*  BUN 30*  CREATININE 6.74*  CALCIUM 8.5*   Liver Function Tests: Recent Labs  Lab 09/12/19 0742  AST 51*  ALT 19  ALKPHOS 201*  BILITOT 0.7  PROT 6.6  ALBUMIN 3.2*   Recent Labs  Lab 09/12/19 0742  LIPASE 149*   No results for input(s): AMMONIA in the last 168  hours. CBC: Recent Labs  Lab 09/12/19 0742 09/13/19 0516  WBC 6.0 5.0  NEUTROABS 3.8  --   HGB 7.1* 8.5*  HCT 25.1* 28.9*  MCV 92.3 88.1  PLT 223 197   Cardiac Enzymes: No results for input(s): CKTOTAL, CKMB, CKMBINDEX, TROPONINI in the last 168 hours. CBG: Recent Labs  Lab 09/12/19 1756 09/12/19 2133 09/13/19 0754  GLUCAP 103* 86 89    Iron Studies:  Recent Labs    09/12/19 1745  IRON 15*  TIBC 333  FERRITIN 35   Studies/Results: CT Chest Wo Contrast  Result Date: 09/12/2019 CLINICAL DATA:  Shortness of breath. Prior left lung surgery EXAM: CT CHEST WITHOUT CONTRAST TECHNIQUE: Multidetector CT imaging of the chest was performed following the standard protocol without IV contrast. COMPARISON:  09/12/2019 FINDINGS: Cardiovascular: Mild cardiomegaly. No pericardial effusion. Extensive coronary artery calcifications. Nonaneurysmal thoracic aorta with scattered atherosclerotic calcification. Mediastinum/Nodes: No axillary, mediastinal, or hilar lymphadenopathy. Mediastinal structures are shifted to the left with postsurgical volume loss of the left hemithorax. 1.9 cm right thyroid lobe nodule. Esophagus unremarkable. There is fluid within the left mainstem bronchus. Lungs/Pleura: Postsurgical changes of left-sided pneumonectomy with expected appearance of the left pleural space. 7 mm nodular density within the right upper lobe with mild  surrounding ground-glass (series 4, image 50). Dependent atelectasis within the right lower lobe. No pleural effusion or pneumothorax. Upper Abdomen: Shrunken, nodular appearance of the liver suggesting cirrhosis. Small volume upper abdominal ascites. Musculoskeletal: Postsurgical changes of posterior left ribs related to pneumonectomy. No acute osseous findings. Degenerative changes of the lower cervical spine. Bilateral gynecomastia. IMPRESSION: 1. Postsurgical changes of left-sided pneumonectomy. 2. Small amount of fluid within the left mainstem  bronchus, some of which is slightly high density and may reflect inspissated secretions. Trachea and right bronchus are clear. 3. 7 mm nodular density within the right upper lobe. Non-contrast chest CT at 6-12 months is recommended. If the nodule is stable at time of repeat CT, then future CT at 18-24 months (from today's scan) is considered optional for low-risk patients, but is recommended for high-risk patients. This recommendation follows the consensus statement: Guidelines for Management of Incidental Pulmonary Nodules Detected on CT Images: From the Fleischner Society 2017; Radiology 2017; 284:228-243. 4. Shrunken, nodular appearance of the liver suggesting cirrhosis. Small volume upper abdominal ascites. 5. 1.9 cm right thyroid lobe nodule. Recommend non emergent thyroid US. (Ref: J Am Coll Radiol. 2015 Feb;12(2): 143-50). 6. Coronary artery atherosclerosis. Aortic Atherosclerosis (ICD10-I70.0). Electronically Signed   By: Davina Poke D.O.   On: 09/12/2019 09:48   US Paracentesis  Result Date: 09/12/2019 INDICATION: Ascites EXAM: ULTRASOUND GUIDED PARACENTESIS MEDICATIONS: 10 cc 1% lidocaine COMPLICATIONS: None immediate. PROCEDURE: Informed written consent was obtained from the patient after a discussion of the risks, benefits and alternatives to treatment. A timeout was performed prior to the initiation of the procedure. Initial ultrasound scanning demonstrates a large amount of ascites within the right lower abdominal quadrant. The right lower abdomen was prepped and draped in the usual sterile fashion. 1% lidocaine was used for local anesthesia. Following this, a 2 G Yueh catheter was introduced. An ultrasound image was saved for documentation purposes. The paracentesis was performed. The catheter was removed and a dressing was applied. The patient tolerated the procedure well without immediate post procedural complication. Patient received post-procedure intravenous albumin; see nursing notes  for details. FINDINGS: A total of approximately 3 liters of blood tinged fluid was removed. IMPRESSION: Successful ultrasound-guided paracentesis yielding 3 liters of peritoneal fluid. Read by Lavonia Drafts [facility] Electronically Signed   By: Markus Daft M.D.   On: 09/12/2019 11:24   DG Chest Portable 1 View  Result Date: 09/12/2019 CLINICAL DATA:  Shortness of breath EXAM: PORTABLE CHEST 1 VIEW COMPARISON:  None. FINDINGS: Complete opacification of the left chest with volume loss. Mild congestion of right-sided vessels. Heart size is largely obscured. Dialysis catheter with tip at the upper cavoatrial junction. Remote left structure chronic left rib deformities. IMPRESSION: 1. White out of the left chest with postoperative changes. There is history of left lobectomy, question left pneumonectomy instead. If there is remaining left lung, would recommend CT to evaluate for lobar collapse/airway obstruction. 2. Vascular congestion in the right lung. Electronically Signed   By: Monte Fantasia M.D.   On: 09/12/2019 08:07    Medications: Infusions: . sodium chloride    . sodium chloride    . sodium chloride      Scheduled Medications: . sodium chloride   Intravenous Once  . allopurinol  100 mg Oral Daily  . amiodarone  100 mg Oral BID  . apixaban  2.5 mg Oral BID  . atorvastatin  20 mg Oral q1800  . Chlorhexidine Gluconate Cloth  6 each Topical Q0600  .  darbepoetin (ARANESP) injection - DIALYSIS  100 mcg Intravenous Q Wed-HD  . gabapentin  100 mg Oral TID  . insulin aspart  0-5 Units Subcutaneous QHS  . insulin aspart  0-6 Units Subcutaneous TID WC  . midodrine  10 mg Oral TID WC  . pantoprazole  40 mg Oral Daily  . polyethylene glycol  17 g Oral Daily  . QUEtiapine  25 mg Oral QHS    have reviewed scheduled and prn medications.  Physical Exam: General:NAD, comfortable Heart:RRR, s1s2 nl Lungs:clear b/l, no crackle Abdomen: Distended abdomen, firm Extremities:LE edema+ Dialysis Access:  Right IJ TDC.  Jhoana Upham Prasad Riyansh Gerstner 09/13/2019,10:27 AM  LOS: 0 days  Pager: [phone]

## 2019-09-13 NOTE — Evaluation (Signed)
Physical Therapy Evaluation Patient Details Name: Andre Smith MRN: [id number] DOB: 09-20-1955 Today's Date: 09/13/2019   History of Present Illness  Andre Smith is a 64 y.o. male with medical history of alcoholic liver cirrhosis, ESRD (MWF), OSA, hyperlipidemia, diabetes mellitus type 2, hypertension, COPD presenting with abdominal distention and tightness for the past 2 to 3 days as well as generalized weakness.  Apparently, the patient was noted to have soft blood pressures at his nursing facility, [facility].  Review of the medical record shows that the patient normally has soft blood pressures and takes midodrine.  Apparently, the patient was transported to the hospital in [place] on 09/11/2019.  Unfortunately, paracentesis was not done for unclear reasons, but it was felt to be due to the patient being on anticoagulation.  As result, the patient's symptoms persisted with abdominal tightness.  The patient did have some shortness of breath, but denied any fevers, chills, chest pain, nausea, vomiting, diarrhea, abdominal pain, hematochezia, melena.  Unfortunately, the patient is a poor historian.  He is unable to provide any additional history.  Aside from his chief complaint, he does not know why he is in the hospital.    Clinical Impression  Patient demonstrates slow labored movement for sitting up at bedside, c/o shakiness in legs when standing and limited due to a few side steps at bedside due to generalized weakness, fatigue and fair/poor standing balance.  Orthostatic BP's as follows: supine 73/59, sitting 80/59, standing 69/54 - RN notified.  Patient put back to bed after therapy due to low BP.  Patient will benefit from continued physical therapy in hospital and recommended venue below to increase strength, balance, endurance for safe ADLs and gait.    Follow Up Recommendations SNF    Equipment Recommendations  None recommended by PT    Recommendations for Other  Services       Precautions / Restrictions Precautions Precautions: Fall Restrictions Weight Bearing Restrictions: No      Mobility  Bed Mobility Overal bed mobility: Needs Assistance Bed Mobility: Supine to Sit;Sit to Supine     Supine to sit: Min assist;Mod assist Sit to supine: Min guard;Min assist   General bed mobility comments: slow labored movement  Transfers Overall transfer level: Needs assistance Equipment used: Rolling walker (2 wheeled) Transfers: Sit to/from Stand Sit to Stand: Min assist         General transfer comment: increased time, labored movement  Ambulation/Gait Ambulation/Gait assistance: Mod assist Gait Distance (Feet): 3 Feet Assistive device: Rolling walker (2 wheeled) Gait Pattern/deviations: Decreased step length - right;Decreased step length - left;Decreased stride length Gait velocity: slow   General Gait Details: limited to 3-4 short unsteady labored side steps at bedside due to c/o fatigue, weakness and feeling slightly lightheaded possibly due to BP dropping while standing  Stairs            Wheelchair Mobility    Modified Rankin (Stroke Patients Only)       Balance Overall balance assessment: Needs assistance Sitting-balance support: Feet supported;No upper extremity supported Sitting balance-Leahy Scale: Fair Sitting balance - Comments: fair/good seated at EOB   Standing balance support: During functional activity;Bilateral upper extremity supported Standing balance-Leahy Scale: Fair Standing balance comment: using RW                             Pertinent Vitals/Pain Pain Assessment: No/denies pain    Home Living Family/patient expects to be discharged to::  Skilled nursing facility                      Prior Function Level of Independence: Needs assistance   Gait / Transfers Assistance Needed: Supervised household ambulator with RW  ADL's / Homemaking Assistance Needed: assisted by SNF  staff  Comments: Patient states he was renting a house with 3 step entrance without siderails, short distanced community ambulator and was driving prior to admission to SNF, but unable to remember how long ago that was     Hand Dominance        Extremity/Trunk Assessment   Upper Extremity Assessment Upper Extremity Assessment: Generalized weakness    Lower Extremity Assessment Lower Extremity Assessment: Generalized weakness    Cervical / Trunk Assessment Cervical / Trunk Assessment: Normal  Communication   Communication: No difficulties  Cognition Arousal/Alertness: Awake/alert Behavior During Therapy: WFL for tasks assessed/performed Overall Cognitive Status: Within Functional Limits for tasks assessed                                        General Comments      Exercises     Assessment/Plan    PT Assessment Patient needs continued PT services  PT Problem List Decreased strength;Decreased activity tolerance;Decreased balance;Decreased mobility       PT Treatment Interventions Gait training;Stair training;Therapeutic activities;Functional mobility training;Therapeutic exercise;Patient/family education    PT Goals (Current goals can be found in the Care Plan section)  Acute Rehab PT Goals Patient Stated Goal: return home after rehab PT Goal Formulation: With patient Time For Goal Achievement: 09/26/19 Potential to Achieve Goals: Fair    Frequency Min 3X/week   Barriers to discharge        Co-evaluation               AM-PAC PT "6 Clicks" Mobility  Outcome Measure Help needed turning from your back to your side while in a flat bed without using bedrails?: A Little Help needed moving from lying on your back to sitting on the side of a flat bed without using bedrails?: A Lot Help needed moving to and from a bed to a chair (including a wheelchair)?: A Lot Help needed standing up from a chair using your arms (e.g., wheelchair or bedside  chair)?: A Little Help needed to walk in hospital room?: A Lot Help needed climbing 3-5 steps with a railing? : A Lot 6 Click Score: 14    End of Session Equipment Utilized During Treatment: Oxygen Activity Tolerance: Patient tolerated treatment well;Patient limited by fatigue Patient left: in bed;with call bell/phone within reach;with bed alarm set Nurse Communication: Mobility status PT Visit Diagnosis: Unsteadiness on feet (R26.81);Other abnormalities of gait and mobility (R26.89);Muscle weakness (generalized) (M62.81)    Time: 3009-2330 PT Time Calculation (min) (ACUTE ONLY): 38 min   Charges:   PT Evaluation $PT Eval High Complexity: 1 High PT Treatments $Therapeutic Activity: 23-37 mins        11:14 AM, 09/13/19 Lonell Grandchild, MPT Physical Therapist with [facility] [phone] office [phone] mobile phone

## 2019-09-13 NOTE — Consult Note (Signed)
Consultation Note Date: 09/13/2019   Patient Name: Andre Smith  DOB: Nov 14, 1955  MRN: [id number]  Age / Sex: 64 y.o., male  PCP: Bonnita Nasuti, MD Referring Physician: Orson Eva, MD  Reason for Consultation: Establishing goals of care  HPI/Patient Profile: 64 y.o. male  with past medical history of alcoholic liver cirrhosis, ESRD on HD MWF, OSA, HLD, atrial fibrillation on chronic anticoagulation, diabetes, HTN, COPD on 3-4L at baseline, h/o lung cancer s/p right pneumonectomy admitted on 09/12/2019 from [facility] with abd distension, generalized weakness, shortness of breath. Admitted with symptomatic anemia (receive 2 units PRBC), s/p paracentesis 3L out 09/12/19 limited by hypotension, hemoccult negative. Renal and GI following.   Clinical Assessment and Goals of Care: I have reviewed electronic records for Andre Smith. Consulted for goals of care conversation with underlying renal failure on dialysis and progressing cirrhosis.   I met today with Andre Smith. No family/visitors at bedside. Andre Smith is very pleasant. He tells me about how he has been away from his home for a while and has no family locally. He shares that he went to [place] to live in a house [facility]) to assist with recovering alcoholics with jobs and then with health decline he wound up in [facility]. He confirms his sister as his emergency contact and surrogate decision maker and says he has a brother somewhere. He has not had much contact with family lately.   We discussed recurring ascites is secondary to worsening liver failure. Discussed that this is very complicated as he has renal failure as well. He knows that he is not a candidate for transplant. We discussed more of his expectations into the future. We discussed that unfortunately his disease with continue to progress and cause more symptoms and problems. This naturally led  into discussion of his wishes.   Andre Smith is very clear about his faith and trust in God. In further conversation he expresses to me multiple times that he would not desire resuscitation "what would be the point?" - he tells me. He shares that he does not want to suffer and trusts God when it is his time. He also does not want for his sister to be burdened with these decisions. He even contemplates cremation (as this is "cheaper" he says) but would prefer burial. I encouraged him to discuss his wishes with his sister. He is very clear that his quality of life is not good, although acceptable, and if this is expected to worse he does NOT want to suffer.   We further discussed that at some stage people consider even stopping aggressive interventions, even dialysis, if they are suffering with poor quality of life. Andre Smith has many concerns about some of these decisions and if stopping care is equivalent to suicide. I explained that this would not be the same as suicide because it would be the illness leading to end of life and not a decision that he makes. At this time he would like DNR and would also like to  continue with all other therapies to prolong his life at this time. If his quality of life continues to decline he would consider other options in the future.   All questions/concerns addressed. Emotional support provided. Discussed with Dr. Carles Smith.   Primary Decision Maker PATIENT    SUMMARY OF RECOMMENDATIONS   - DNR decided - Continue with other therapies/interventions to prolong life but quality of life also very important to him   Code Status/Advance Care Planning:  DNR   Symptom Management:   Per attending, GI, renal.   Palliative Prophylaxis:   Delirium Protocol  Additional Recommendations (Limitations, Scope, Preferences):  Full Scope Treatment  Psycho-social/Spiritual:   Desire for further Chaplaincy support:yes  Prognosis:   Overall prognosis poor with progressing  cirrhosis with underlying renal failure on dialysis.   Discharge Planning: Return to SNF with outpatient palliative to follow     Primary Diagnoses: Present on Admission: . Symptomatic anemia   I have reviewed the medical record, interviewed the patient and family, and examined the patient. The following aspects are pertinent.  Past Medical History:  Diagnosis Date  . A-fib ([place])   . Anemia   . Cancer ([place])    lung  . Cardiomyopathy ([place])   . CHF (congestive heart failure) ([place])   . Cirrhosis of liver with ascites ([place])   . COPD (chronic obstructive pulmonary disease) ([place])   . Dementia ([place])   . Diabetes mellitus without complication ([place])   . Dysphagia   . GERD (gastroesophageal reflux disease)   . Gout   . Hemoptysis   . Hyperlipemia   . Pulmonary hypertension ([place])   . PVD (peripheral vascular disease) ([place])   . Renal disorder    Social History   Socioeconomic History  . Marital status: Single    Spouse name: Not on file  . Number of children: Not on file  . Years of education: Not on file  . Highest education level: Not on file  Occupational History  . Not on file  Tobacco Use  . Smoking status: Former Smoker  Substance and Sexual Activity  . Alcohol use: Never  . Drug use: Never  . Sexual activity: Not on file  Other Topics Concern  . Not on file  Social History Narrative  . Not on file   Social Determinants of Health   Financial Resource Strain:   . Difficulty of Paying Living Expenses:   Food Insecurity:   . Worried About Charity fundraiser in the Last Year:   . Arboriculturist in the Last Year:   Transportation Needs:   . Film/video editor (Medical):   Marland Kitchen Lack of Transportation (Non-Medical):   Physical Activity:   . Days of Exercise per Week:   . Minutes of Exercise per Session:   Stress:   . Feeling of Stress :   Social Connections:   . Frequency of Communication with Friends and Family:   . Frequency of Social Gatherings with  Friends and Family:   . Attends Religious Services:   . Active Member of Clubs or Organizations:   . Attends Archivist Meetings:   Marland Kitchen Marital Status:    History reviewed. No pertinent family history. Scheduled Meds: . sodium chloride   Intravenous Once  . allopurinol  100 mg Oral Daily  . amiodarone  100 mg Oral BID  . apixaban  2.5 mg Oral BID  . atorvastatin  20 mg Oral q1800  . Chlorhexidine Gluconate Cloth  6  each Topical [id number]  . Chlorhexidine Gluconate Cloth  6 each Topical Q0600  . darbepoetin (ARANESP) injection - DIALYSIS  100 mcg Intravenous Q Wed-HD  . gabapentin  100 mg Oral TID  . insulin aspart  0-5 Units Subcutaneous QHS  . insulin aspart  0-6 Units Subcutaneous TID WC  . midodrine  10 mg Oral TID WC  . pantoprazole  40 mg Oral Daily  . polyethylene glycol  17 g Oral Daily  . QUEtiapine  25 mg Oral QHS   Continuous Infusions: . sodium chloride    . sodium chloride    . sodium chloride    . ferric gluconate (FERRLECIT/NULECIT) IV     PRN Meds:.sodium chloride, sodium chloride, acetaminophen **OR** acetaminophen, alteplase, heparin, ipratropium-albuterol, ondansetron **OR** ondansetron (ZOFRAN) IV Allergies  Allergen Reactions  . Aspirin     Other reaction(s): increases sugar  . Nickel Rash   Review of Systems  Constitutional: Positive for activity change.  Respiratory: Positive for shortness of breath.   Gastrointestinal: Positive for abdominal distention.    Physical Exam Vitals and nursing note reviewed.  Constitutional:      General: He is not in acute distress.    Appearance: He is ill-appearing.  Cardiovascular:     Rate and Rhythm: Normal rate.  Pulmonary:     Effort: Pulmonary effort is normal. No tachypnea, accessory muscle usage or respiratory distress.  Abdominal:     General: There is distension.     Palpations: There is fluid wave.     Tenderness: There is no abdominal tenderness.  Neurological:     Mental Status: He is  alert and oriented to person, place, and time.     Comments: Somewhat forgetful with timelines and history but fully aware of his conditions and able to express his thoughts and wishes clearly with good rationale during my conversation.      Vital Signs: BP 94/62   Pulse 88   Temp 98.7 F (37.1 C) (Oral)   Resp 18   Ht 5' 8"  (1.727 m)   Wt 94 kg   SpO2 96%   BMI 31.51 kg/m  Pain Scale: 0-10   Pain Score: 0-No pain   SpO2: SpO2: 96 % O2 Device:SpO2: 96 % O2 Flow Rate: .O2 Flow Rate (L/min): 3 L/min  IO: Intake/output summary:   Intake/Output Summary (Last 24 hours) at 09/13/2019 1135 Last data filed at 09/13/2019 0900 Gross per 24 hour  Intake 850 ml  Output 1540 ml  Net -690 ml    LBM: Last BM Date: 09/11/19 Baseline Weight: Weight: 94 kg Most recent weight: Weight: 94 kg     Palliative Assessment/Data:     Time In: 1400 Time Out: 1450 Time Total: 50 min Greater than 50%  of this time was spent counseling and coordinating care related to the above assessment and plan.  Signed by: Vinie Sill, NP Palliative Medicine Team Pager # [phone] (M-F 8a-5p) Team Phone # [phone] (Nights/Weekends)

## 2019-09-13 NOTE — Progress Notes (Signed)
MD notified of current manual BP 86/58. Pt in trendelenburg, will monitor until further instruction are provided from MD

## 2019-09-13 NOTE — Plan of Care (Signed)
  Problem: Acute Rehab PT Goals(only PT should resolve) Goal: Pt Will Go Supine/Side To Sit Outcome: Progressing Flowsheets (Taken 09/13/2019 1117) Pt will go Supine/Side to Sit:  with min guard assist  with minimal assist Goal: Patient Will Transfer Sit To/From Stand Outcome: Progressing Flowsheets (Taken 09/13/2019 1117) Patient will transfer sit to/from stand:  with min guard assist  with minimal assist Goal: Pt Will Transfer Bed To Chair/Chair To Bed Outcome: Progressing Flowsheets (Taken 09/13/2019 1117) Pt will Transfer Bed to Chair/Chair to Bed: with min assist Goal: Pt Will Ambulate Outcome: Progressing Flowsheets (Taken 09/13/2019 1117) Pt will Ambulate:  50 feet  with minimal assist  with rolling walker   11:18 AM, 09/13/19 Lonell Grandchild, MPT Physical Therapist with [facility] [phone] office [phone] mobile phone

## 2019-09-13 NOTE — Progress Notes (Signed)
Dressing change to Right lower abdomen due to leakage from paracentesis site. Removed old dressing. Dressing was heavily soiled with serosanguinous drainage. Cleaned the area around the incision and reapplied clean gauze reinforced by 3 abd pads and tape. Placed additional chucks under the pt side to prevent the bed from being soiled again. Pt tolerated well

## 2019-09-13 NOTE — Progress Notes (Signed)
Dr Tat notified of orthostatic blood pressures when working with physical therapy.   Lying 73/59  Sitting 80/59  Standing 69/54  Patient did report to physical therapy that his legs were shaky and he was lightheaded when standing.   Nephrology MD at bedside and also notified of blood pressure readings.

## 2019-09-13 NOTE — Progress Notes (Signed)
MD notified patient's BP stayed low overnight and this morning his manual BP was 94/62, HR 83. Patient remains in trendelenberg position. Paracentesis site still requiring frequent changes.

## 2019-09-14 DIAGNOSIS — Z66 Do not resuscitate: Secondary | ICD-10-CM

## 2019-09-14 DIAGNOSIS — J9611 Chronic respiratory failure with hypoxia: Secondary | ICD-10-CM

## 2019-09-14 DIAGNOSIS — Z7189 Other specified counseling: Secondary | ICD-10-CM

## 2019-09-14 LAB — CBC
HCT: 31.8 % — ABNORMAL LOW (ref 39.0–52.0)
Hemoglobin: 9.2 g/dL — ABNORMAL LOW (ref 13.0–17.0)
MCH: 26 pg (ref 26.0–34.0)
MCHC: 28.9 g/dL — ABNORMAL LOW (ref 30.0–36.0)
MCV: 89.8 fL (ref 80.0–100.0)
Platelets: 201 10*3/uL (ref 150–400)
RBC: 3.54 MIL/uL — ABNORMAL LOW (ref 4.22–5.81)
RDW: 16.6 % — ABNORMAL HIGH (ref 11.5–15.5)
WBC: 5.2 10*3/uL (ref 4.0–10.5)
nRBC: 0 % (ref 0.0–0.2)

## 2019-09-14 LAB — PHOSPHORUS: Phosphorus: 3.9 mg/dL (ref 2.5–4.6)

## 2019-09-14 LAB — GLUCOSE, CAPILLARY
Glucose-Capillary: 74 mg/dL (ref 70–99)
Glucose-Capillary: 115 mg/dL — ABNORMAL HIGH (ref 70–99)
Glucose-Capillary: 98 mg/dL (ref 70–99)

## 2019-09-14 MED ORDER — SODIUM CHLORIDE 0.9 % IV SOLN
250.0000 mg | Freq: Every day | INTRAVENOUS | Status: DC
  Filled 2019-09-14 (×2): qty 20

## 2019-09-14 MED ORDER — ALBUMIN HUMAN 25 % IV SOLN
50.0000 g | Freq: Once | INTRAVENOUS | Status: AC
  Administered 2019-09-14: 50 g via INTRAVENOUS
  Filled 2019-09-14: qty 100

## 2019-09-14 MED ORDER — FERROUS SULFATE 325 (65 FE) MG PO TABS: 325.0000 mg | ORAL_TABLET | Freq: Every day | ORAL | Status: DC

## 2019-09-14 MED ORDER — ALLOPURINOL 100 MG PO TABS: 100.0000 mg | ORAL_TABLET | Freq: Every day | ORAL | Status: AC

## 2019-09-14 MED ORDER — MIDODRINE HCL 5 MG PO TABS
10.0000 mg | ORAL_TABLET | Freq: Once | ORAL | Status: AC
  Administered 2019-09-14: 10 mg via ORAL
  Filled 2019-09-14: qty 2

## 2019-09-14 MED ORDER — MIDODRINE HCL 10 MG PO TABS: 10.0000 mg | ORAL_TABLET | Freq: Three times a day (TID) | ORAL | Status: AC

## 2019-09-14 MED ORDER — FERROUS SULFATE 325 (65 FE) MG PO TABS: 325.0000 mg | ORAL_TABLET | Freq: Every day | ORAL | 3 refills | Status: AC

## 2019-09-14 MED ORDER — APIXABAN 2.5 MG PO TABS: 2.5000 mg | ORAL_TABLET | Freq: Two times a day (BID) | ORAL | Status: AC

## 2019-09-14 NOTE — Procedures (Signed)
     HEMODIALYSIS TREATMENT NOTE:  4 hour heparin-free dialysis completed via right IJ tunneled catheter. UF limited by hypotension despite administration of Albumin 50g and Midodrine 10mg  po.  Net UF 589cc.  All blood was returned.   Rockwell Alexandria, RN

## 2019-09-14 NOTE — Discharge Summary (Addendum)
Physician Discharge Summary  Andre Smith JYN:[id number] DOB: [birth year] DOA: 09/12/2019  PCP: Bonnita Nasuti, MD  Admit date: 09/12/2019 Discharge date: 09/14/2019  Admitted From: SNF Disposition:  SNF  Recommendations for Outpatient Follow-up:  1. Follow up with PCP in 1-2 weeks 2. Please obtain BMP/CBC in one week 3. Maintain patient on 2L nasal cannula  Discharge Condition: Stable CODE STATUS: DNR Diet recommendation: Heart Healthy/Carb modified   Brief/Interim Summary: 64 y.o.malewith medical history ofalcoholic liver cirrhosis, ESRD(MWF),OSA, hyperlipidemia, diabetes mellitus type 2, hypertension, COPD presenting with abdominal distention and tightness for the past 2 to 3 days as well as generalized weakness. Apparently, the patient was noted to have soft blood pressures at his nursing facility, [facility].Review of the medical record shows that the patient normally has soft blood pressures and takes midodrine. Apparently, the patient was transported to the hospital in [place] on 09/11/2019. Unfortunately, paracentesis was not done for unclear reasons, but it was felt to be due to the patient being on anticoagulation. As result, the patient's symptoms persisted with abdominal tightness. The patient did have some shortness of breath, but denied any fevers, chills, chest pain, nausea, vomiting, diarrhea, abdominal pain, hematochezia, melena. Unfortunately, the patient is a poor historian.  In the emergency department, the patient was afebrile and hemodynamically stable albeit with soft blood pressure of 96/70. Oxygen saturation was 100% on 4 L. Chest x-ray showed vascular congestion. CT of the chest showed pneumonectomy on the right. There was a 7 mm nodular density in the right upper lobe with mild groundglass surrounding opacity. There is a cirrhotic liver with small volume ascites. There is a small amount of fluid within the left mainstem  bronchus some of which is slightly high density and may reflect inspissated secretions. 2 units PRBC was ordered. Nephrology was consulted to assist with management.Paracentesis was performed and 3 L wasremoved with symptomatic improvement of his abdominal tightness.  Discharge Diagnoses:  Symptomatic anemia -2 units PRBC transfused -Patient had hemoglobin of 7.9 on 09/04/2019 -FOBT negative -09/04/2019 serum Q65--7846 -9/62/9528 folic acid 41.3 -Check iron studies--iron sat 5%, ferritin 35-->IV nulecit given during hospitalization -d/c on ferrous sulfate -Hgb remained stable after transfusion--Hgb 9.2 on day of d/c  Generalized weakness -Likely secondary to symptomatic anemia -Check TSH--3.494 -Check free T4--1.11 -PT evaluation-->SNF  Decompensated Alcoholic liver cirrhosis with ascites -3/24--Paracentesis--3L removed -GI consult appreciated--no further work up presently -no further drainage from from paracentesis site  Atrial fibrillation, type unknown -Presently in sinus rhythm -Continue amiodarone -Continue apixaban -stopped metoprolol tartrate due to soft BPs-->remained rate controlled  ESRD -Nephrology consulted -Dialysis done on 3/26  Orthostatic Hypotension -increase midodrine to 10 mg tid -pt has soft BPs at baseline  Chronic respiratory failure with hypoxia -stable on 2L on day of d/c  Hyperlipidemia -Continue statin  Diabetes mellitus type 2 -NovoLog sliding scale -09/04/2019 hemoglobin A1c 6.3 -CBGs controlled  History of lung cancer -Status post right pneumonectomy  Left leg pain -venous duplex--neg  Goals of Care -met with palliative medicine--goals of care discussed -changed to DNR     Discharge Instructions   Allergies as of 09/14/2019      Reactions   Aspirin    Other reaction(s): increases sugar   Nickel Rash      Medication List    STOP taking these medications   metoprolol tartrate 50 MG tablet Commonly  known as: LOPRESSOR     TAKE these medications   albuterol 108 (90 Base) MCG/ACT inhaler Commonly known as: VENTOLIN HFA  Inhale 2 puffs into the lungs every 4 (four) hours as needed.   allopurinol 100 MG tablet Commonly known as: ZYLOPRIM Take 1 tablet (100 mg total) by mouth daily. Start taking on: September 15, 2019 What changed:   medication strength  how much to take  additional instructions   amiodarone 100 MG tablet Commonly known as: PACERONE Take 1 tablet by mouth 2 (two) times daily.   apixaban 2.5 MG Tabs tablet Commonly known as: ELIQUIS Take 1 tablet (2.5 mg total) by mouth 2 (two) times daily. What changed:   medication strength  how much to take   atorvastatin 20 MG tablet Commonly known as: LIPITOR Take 20 mg by mouth at bedtime.   ferrous sulfate 325 (65 FE) MG tablet Take 1 tablet (325 mg total) by mouth daily with breakfast. Start taking on: September 15, 2019   gabapentin 300 MG capsule Commonly known as: NEURONTIN Take 300 mg by mouth 3 (three) times daily.   HumaLOG KwikPen 100 UNIT/ML KwikPen Generic drug: insulin lispro Inject 0-10 Units into the skin 4 (four) times daily. Before meals and at bedtime   midodrine 10 MG tablet Commonly known as: PROAMATINE Take 1 tablet (10 mg total) by mouth 3 (three) times daily with meals. What changed:   medication strength  how much to take  when to take this  additional instructions   MiraLax 17 g packet Generic drug: polyethylene glycol Take 1 packet by mouth at bedtime.   omeprazole 40 MG capsule Commonly known as: PRILOSEC Take 1 capsule by mouth in the morning.   QUEtiapine 25 MG tablet Commonly known as: SEROQUEL Take 25 mg by mouth at bedtime.       Allergies  Allergen Reactions  . Aspirin     Other reaction(s): increases sugar  . Nickel Rash    Consultations:  renal   Procedures/Studies: CT Chest Wo Contrast  Result Date: 09/12/2019 CLINICAL DATA:  Shortness of  breath. Prior left lung surgery EXAM: CT CHEST WITHOUT CONTRAST TECHNIQUE: Multidetector CT imaging of the chest was performed following the standard protocol without IV contrast. COMPARISON:  09/12/2019 FINDINGS: Cardiovascular: Mild cardiomegaly. No pericardial effusion. Extensive coronary artery calcifications. Nonaneurysmal thoracic aorta with scattered atherosclerotic calcification. Mediastinum/Nodes: No axillary, mediastinal, or hilar lymphadenopathy. Mediastinal structures are shifted to the left with postsurgical volume loss of the left hemithorax. 1.9 cm right thyroid lobe nodule. Esophagus unremarkable. There is fluid within the left mainstem bronchus. Lungs/Pleura: Postsurgical changes of left-sided pneumonectomy with expected appearance of the left pleural space. 7 mm nodular density within the right upper lobe with mild surrounding ground-glass (series 4, image 50). Dependent atelectasis within the right lower lobe. No pleural effusion or pneumothorax. Upper Abdomen: Shrunken, nodular appearance of the liver suggesting cirrhosis. Small volume upper abdominal ascites. Musculoskeletal: Postsurgical changes of posterior left ribs related to pneumonectomy. No acute osseous findings. Degenerative changes of the lower cervical spine. Bilateral gynecomastia. IMPRESSION: 1. Postsurgical changes of left-sided pneumonectomy. 2. Small amount of fluid within the left mainstem bronchus, some of which is slightly high density and may reflect inspissated secretions. Trachea and right bronchus are clear. 3. 7 mm nodular density within the right upper lobe. Non-contrast chest CT at 6-12 months is recommended. If the nodule is stable at time of repeat CT, then future CT at 18-24 months (from today's scan) is considered optional for low-risk patients, but is recommended for high-risk patients. This recommendation follows the consensus statement: Guidelines for Management of Incidental Pulmonary Nodules Detected on  CT  Images: From the Fleischner Society 2017; Radiology 2017; 284:228-243. 4. Shrunken, nodular appearance of the liver suggesting cirrhosis. Small volume upper abdominal ascites. 5. 1.9 cm right thyroid lobe nodule. Recommend non emergent thyroid US. (Ref: J Am Coll Radiol. 2015 Feb;12(2): 143-50). 6. Coronary artery atherosclerosis. Aortic Atherosclerosis (ICD10-I70.0). Electronically Signed   By: Davina Poke D.O.   On: 09/12/2019 09:48   US Venous Img Lower Unilateral Left (DVT)  Result Date: 09/13/2019 CLINICAL DATA:  LEFT lower extremity pain EXAM: LEFT LOWER EXTREMITY VENOUS DOPPLER ULTRASOUND TECHNIQUE: Gray-scale sonography with compression, as well as color and duplex ultrasound, were performed to evaluate the deep venous system(s) from the level of the common femoral vein through the popliteal and proximal calf veins. COMPARISON:  None FINDINGS: VENOUS Normal compressibility of the common femoral, superficial femoral, and popliteal veins, as well as the visualized calf veins. Visualized portions of profunda femoral vein and great saphenous vein unremarkable. No filling defects to suggest DVT on grayscale or color Doppler imaging. Doppler waveforms show normal direction of venous flow, normal respiratory phasicity and response to augmentation. Limited views of the contralateral common femoral vein are unremarkable. OTHER None. Limitations: none IMPRESSION: No femoropopliteal DVT nor evidence of DVT within the visualized calf veins in the LEFT leg. If clinical symptoms are inconsistent or if there are persistent or worsening symptoms, further imaging (possibly involving the iliac veins) may be warranted. Electronically Signed   By: Lavonia Dana M.D.   On: 09/13/2019 14:35   US Paracentesis  Result Date: 09/12/2019 INDICATION: Ascites EXAM: ULTRASOUND GUIDED PARACENTESIS MEDICATIONS: 10 cc 1% lidocaine COMPLICATIONS: None immediate. PROCEDURE: Informed written consent was obtained from the patient  after a discussion of the risks, benefits and alternatives to treatment. A timeout was performed prior to the initiation of the procedure. Initial ultrasound scanning demonstrates a large amount of ascites within the right lower abdominal quadrant. The right lower abdomen was prepped and draped in the usual sterile fashion. 1% lidocaine was used for local anesthesia. Following this, a 91 G Yueh catheter was introduced. An ultrasound image was saved for documentation purposes. The paracentesis was performed. The catheter was removed and a dressing was applied. The patient tolerated the procedure well without immediate post procedural complication. Patient received post-procedure intravenous albumin; see nursing notes for details. FINDINGS: A total of approximately 3 liters of blood tinged fluid was removed. IMPRESSION: Successful ultrasound-guided paracentesis yielding 3 liters of peritoneal fluid. Read by Lavonia Drafts [facility] Electronically Signed   By: Markus Daft M.D.   On: 09/12/2019 11:24   DG Chest Portable 1 View  Result Date: 09/12/2019 CLINICAL DATA:  Shortness of breath EXAM: PORTABLE CHEST 1 VIEW COMPARISON:  None. FINDINGS: Complete opacification of the left chest with volume loss. Mild congestion of right-sided vessels. Heart size is largely obscured. Dialysis catheter with tip at the upper cavoatrial junction. Remote left structure chronic left rib deformities. IMPRESSION: 1. White out of the left chest with postoperative changes. There is history of left lobectomy, question left pneumonectomy instead. If there is remaining left lung, would recommend CT to evaluate for lobar collapse/airway obstruction. 2. Vascular congestion in the right lung. Electronically Signed   By: Monte Fantasia M.D.   On: 09/12/2019 08:07        Discharge Exam: Vitals:   09/14/19 0506 09/14/19 0955  BP: 93/69   Pulse: 73   Resp: 17   Temp: 98 F (36.7 C)   SpO2: 100% 98%   Vitals:  09/13/19 1420 09/13/19  2133 09/14/19 0506 09/14/19 0955  BP: 91/67 102/63 93/69   Pulse: 91 74 73   Resp: 20 17 17    Temp: (!) 97.1 F (36.2 C) 98.6 F (37 C) 98 F (36.7 C)   TempSrc:  Oral Oral   SpO2: 98% 100% 100% 98%  Weight:      Height:        General: Pt is alert, awake, not in acute distress Cardiovascular: RRR, S1/S2 +, no rubs, no gallops Respiratory: diminished BS.  Bibasilar rales. No wheeze Abdominal: Soft, NT, ND, bowel sounds + Extremities: no edema, no cyanosis   The results of significant diagnostics from this hospitalization (including imaging, microbiology, ancillary and laboratory) are listed below for reference.    Significant Diagnostic Studies: CT Chest Wo Contrast  Result Date: 09/12/2019 CLINICAL DATA:  Shortness of breath. Prior left lung surgery EXAM: CT CHEST WITHOUT CONTRAST TECHNIQUE: Multidetector CT imaging of the chest was performed following the standard protocol without IV contrast. COMPARISON:  09/12/2019 FINDINGS: Cardiovascular: Mild cardiomegaly. No pericardial effusion. Extensive coronary artery calcifications. Nonaneurysmal thoracic aorta with scattered atherosclerotic calcification. Mediastinum/Nodes: No axillary, mediastinal, or hilar lymphadenopathy. Mediastinal structures are shifted to the left with postsurgical volume loss of the left hemithorax. 1.9 cm right thyroid lobe nodule. Esophagus unremarkable. There is fluid within the left mainstem bronchus. Lungs/Pleura: Postsurgical changes of left-sided pneumonectomy with expected appearance of the left pleural space. 7 mm nodular density within the right upper lobe with mild surrounding ground-glass (series 4, image 50). Dependent atelectasis within the right lower lobe. No pleural effusion or pneumothorax. Upper Abdomen: Shrunken, nodular appearance of the liver suggesting cirrhosis. Small volume upper abdominal ascites. Musculoskeletal: Postsurgical changes of posterior left ribs related to pneumonectomy. No acute  osseous findings. Degenerative changes of the lower cervical spine. Bilateral gynecomastia. IMPRESSION: 1. Postsurgical changes of left-sided pneumonectomy. 2. Small amount of fluid within the left mainstem bronchus, some of which is slightly high density and may reflect inspissated secretions. Trachea and right bronchus are clear. 3. 7 mm nodular density within the right upper lobe. Non-contrast chest CT at 6-12 months is recommended. If the nodule is stable at time of repeat CT, then future CT at 18-24 months (from today's scan) is considered optional for low-risk patients, but is recommended for high-risk patients. This recommendation follows the consensus statement: Guidelines for Management of Incidental Pulmonary Nodules Detected on CT Images: From the Fleischner Society 2017; Radiology 2017; 284:228-243. 4. Shrunken, nodular appearance of the liver suggesting cirrhosis. Small volume upper abdominal ascites. 5. 1.9 cm right thyroid lobe nodule. Recommend non emergent thyroid US. (Ref: J Am Coll Radiol. 2015 Feb;12(2): 143-50). 6. Coronary artery atherosclerosis. Aortic Atherosclerosis (ICD10-I70.0). Electronically Signed   By: Davina Poke D.O.   On: 09/12/2019 09:48   US Venous Img Lower Unilateral Left (DVT)  Result Date: 09/13/2019 CLINICAL DATA:  LEFT lower extremity pain EXAM: LEFT LOWER EXTREMITY VENOUS DOPPLER ULTRASOUND TECHNIQUE: Gray-scale sonography with compression, as well as color and duplex ultrasound, were performed to evaluate the deep venous system(s) from the level of the common femoral vein through the popliteal and proximal calf veins. COMPARISON:  None FINDINGS: VENOUS Normal compressibility of the common femoral, superficial femoral, and popliteal veins, as well as the visualized calf veins. Visualized portions of profunda femoral vein and great saphenous vein unremarkable. No filling defects to suggest DVT on grayscale or color Doppler imaging. Doppler waveforms show normal  direction of venous flow, normal respiratory phasicity and response  to augmentation. Limited views of the contralateral common femoral vein are unremarkable. OTHER None. Limitations: none IMPRESSION: No femoropopliteal DVT nor evidence of DVT within the visualized calf veins in the LEFT leg. If clinical symptoms are inconsistent or if there are persistent or worsening symptoms, further imaging (possibly involving the iliac veins) may be warranted. Electronically Signed   By: Lavonia Dana M.D.   On: 09/13/2019 14:35   US Paracentesis  Result Date: 09/12/2019 INDICATION: Ascites EXAM: ULTRASOUND GUIDED PARACENTESIS MEDICATIONS: 10 cc 1% lidocaine COMPLICATIONS: None immediate. PROCEDURE: Informed written consent was obtained from the patient after a discussion of the risks, benefits and alternatives to treatment. A timeout was performed prior to the initiation of the procedure. Initial ultrasound scanning demonstrates a large amount of ascites within the right lower abdominal quadrant. The right lower abdomen was prepped and draped in the usual sterile fashion. 1% lidocaine was used for local anesthesia. Following this, a 78 G Yueh catheter was introduced. An ultrasound image was saved for documentation purposes. The paracentesis was performed. The catheter was removed and a dressing was applied. The patient tolerated the procedure well without immediate post procedural complication. Patient received post-procedure intravenous albumin; see nursing notes for details. FINDINGS: A total of approximately 3 liters of blood tinged fluid was removed. IMPRESSION: Successful ultrasound-guided paracentesis yielding 3 liters of peritoneal fluid. Read by Lavonia Drafts [facility] Electronically Signed   By: Markus Daft M.D.   On: 09/12/2019 11:24   DG Chest Portable 1 View  Result Date: 09/12/2019 CLINICAL DATA:  Shortness of breath EXAM: PORTABLE CHEST 1 VIEW COMPARISON:  None. FINDINGS: Complete opacification of the left chest  with volume loss. Mild congestion of right-sided vessels. Heart size is largely obscured. Dialysis catheter with tip at the upper cavoatrial junction. Remote left structure chronic left rib deformities. IMPRESSION: 1. White out of the left chest with postoperative changes. There is history of left lobectomy, question left pneumonectomy instead. If there is remaining left lung, would recommend CT to evaluate for lobar collapse/airway obstruction. 2. Vascular congestion in the right lung. Electronically Signed   By: Monte Fantasia M.D.   On: 09/12/2019 08:07     Microbiology: Recent Results (from the past 240 hour(s))  Respiratory Panel by RT PCR (Flu A&B, Covid) - Nasopharyngeal Swab     Status: None   Collection Time: 09/12/19  7:42 AM   Specimen: Nasopharyngeal Swab  Result Value Ref Range Status   SARS Coronavirus 2 by RT PCR NEGATIVE NEGATIVE Final    Comment: (NOTE) SARS-CoV-2 target nucleic acids are NOT DETECTED. The SARS-CoV-2 RNA is generally detectable in upper respiratoy specimens during the acute phase of infection. The lowest concentration of SARS-CoV-2 viral copies this assay can detect is 131 copies/mL. A negative result does not preclude SARS-Cov-2 infection and should not be used as the sole basis for treatment or other patient management decisions. A negative result may occur with  improper specimen collection/handling, submission of specimen other than nasopharyngeal swab, presence of viral mutation(s) within the areas targeted by this assay, and inadequate number of viral copies (<131 copies/mL). A negative result must be combined with clinical observations, patient history, and epidemiological information. The expected result is Negative. Fact Sheet for Patients:  PinkCheek.be Fact Sheet for Healthcare Providers:  GravelBags.it This test is not yet ap proved or cleared by the [place] FDA and  has been  authorized for detection and/or diagnosis of SARS-CoV-2 by FDA under an Emergency Use Authorization (EUA). This  EUA will remain  in effect (meaning this test can be used) for the duration of the COVID-19 declaration under Section 564(b)(1) of the Act, 21 U.S.C. section 360bbb-3(b)(1), unless the authorization is terminated or revoked sooner.    Influenza A by PCR NEGATIVE NEGATIVE Final   Influenza B by PCR NEGATIVE NEGATIVE Final    Comment: (NOTE) The Xpert Xpress SARS-CoV-2/FLU/RSV assay is intended as an aid in  the diagnosis of influenza from Nasopharyngeal swab specimens and  should not be used as a sole basis for treatment. Nasal washings and  aspirates are unacceptable for Xpert Xpress SARS-CoV-2/FLU/RSV  testing. Fact Sheet for Patients: PinkCheek.be Fact Sheet for Healthcare Providers: GravelBags.it This test is not yet approved or cleared by the [place] FDA and  has been authorized for detection and/or diagnosis of SARS-CoV-2 by  FDA under an Emergency Use Authorization (EUA). This EUA will remain  in effect (meaning this test can be used) for the duration of the  Covid-19 declaration under Section 564(b)(1) of the Act, 21  U.S.C. section 360bbb-3(b)(1), unless the authorization is  terminated or revoked. Performed at [facility], [address], [place], [place] [address]   Culture, blood (Routine X 2) w Reflex to ID Panel     Status: None (Preliminary result)   Collection Time: 09/12/19  5:51 PM   Specimen: BLOOD  Result Value Ref Range Status   Specimen Description BLOOD DRAWN BY DIALYSIS PIC LINE  Final   Special Requests   Final    BOTTLES DRAWN AEROBIC AND ANAEROBIC Blood Culture adequate volume   Culture   Final    NO GROWTH 2 DAYS Performed at [facility], [address] [place], [place] [address]    Report Status PENDING  Incomplete  Culture, blood (Routine X 2) w Reflex to ID Panel     Status:  None (Preliminary result)   Collection Time: 09/12/19  5:52 PM   Specimen: BLOOD  Result Value Ref Range Status   Specimen Description BLOOD PIC LINE DRAWN BY DIALYSIS  Final   Special Requests   Final    BOTTLES DRAWN AEROBIC AND ANAEROBIC Blood Culture adequate volume   Culture   Final    NO GROWTH 2 DAYS Performed at [facility], [address] [place],  [address]    Report Status PENDING  Incomplete     Labs: Basic Metabolic Panel: Recent Labs  Lab 09/12/19 0742 09/14/19 0500  NA 136  --   K 5.2*  --   CL 94*  --   CO2 31  --   GLUCOSE 154*  --   BUN 30*  --   CREATININE 6.74*  --   CALCIUM 8.5*  --   PHOS  --  3.9   Liver Function Tests: Recent Labs  Lab 09/12/19 0742  AST 51*  ALT 19  ALKPHOS 201*  BILITOT 0.7  PROT 6.6  ALBUMIN 3.2*   Recent Labs  Lab 09/12/19 0742  LIPASE 149*   No results for input(s): AMMONIA in the last 168 hours. CBC: Recent Labs  Lab 09/12/19 0742 09/13/19 0516 09/14/19 0500  WBC 6.0 5.0 5.2  NEUTROABS 3.8  --   --   HGB 7.1* 8.5* 9.2*  HCT 25.1* 28.9* 31.8*  MCV 92.3 88.1 89.8  PLT 223 197 201   Cardiac Enzymes: No results for input(s): CKTOTAL, CKMB, CKMBINDEX, TROPONINI in the last 168 hours. BNP: Invalid input(s): POCBNP CBG: Recent Labs  Lab 09/13/19 1123 09/13/19 1610  09/13/19 2134 09/14/19 0734 09/14/19 1110  GLUCAP 154* 108* 96 74 115*    Time coordinating discharge:  36 minutes  Signed:  Orson Eva, DO [facility] Pager: [phone] 09/14/2019, 11:39 AM

## 2019-09-14 NOTE — TOC Transition Note (Signed)
Transition of Care [facility]) - CM/SW Discharge Note   Patient Details  Name: Andre Smith MRN: [id number] Date of Birth: Apr 23, 1956  Transition of Care [facility]) CM/SW Contact:  Sherie Don, LCSW Phone Number: 09/14/2019, 3:26 PM   Clinical Narrative: TOC informed patient will be ready for discharge after completing dialysis. Spoke with Marden Noble at [facility] to get information for patient's discharge. Marden Noble stated patient will go to room 303A and the number to call for report is [phone] and the RN is to ask for the hall nurse using the room number. CSW called [facility] and spoke with Wells Guiles to set up transport for 5:30pm. CSW provided RN, Lunette Stands, with information to call for report. Medical necessity form completed and printed for RN. TOC signing off.   Final next level of care: Long Term Nursing Home Barriers to Discharge: Barriers Resolved   Patient Goals and CMS Choice Patient states their goals for this hospitalization and ongoing recovery are:: Return to [facility]      Discharge Placement [facility]  Discharge Plan and Services    Readmission Risk Interventions No flowsheet data found.

## 2019-09-14 NOTE — Progress Notes (Signed)
Patient did not express immediate concern at this time, however, asked that prayer be offered for strength and healing.

## 2019-09-14 NOTE — Progress Notes (Signed)
[facility] NEPHROLOGY PROGRESS NOTE  Assessment/ Plan: Pt is a 64 y.o. yo male with history of alcoholic liver disease with cirrhosis, ascites, ESRD on HD at [facility], DM, HTN, OSA sent from nursing home for abdominal distention and shortness of breath.  # ESRD MWF at [facility]: Last HD on 3/24 with net 1.5 L removal.  Plan for HD today.  Right IJ TDC for the access  # Anemia due to ESRD ? Bleed as patient is on anticoagulation: Received 2 units of blood transfusion on admission.  Iron saturation 5%.  I will start IV iron and continue ESA.  Seen by GI, no plan for endoscopy at this time.  Seen by palliative care.  #Hypotension/volume: Complicated by ascites.  Increased midodrine to 10 mg 3 times daily.  Monitor BP.  Albumin as needed during dialysis.  #CKD-MBD: Check phosphorus level.  #Alcoholic liver cirrhosis with large volume ascites is status post paracentesis with 3 L fluid removal on 3/24.  # Dispo; noted patient is seen by palliative care and he is now DNR.  Okay to discharge to SNF from [facility] today after dialysis.  Subjective: Seen and examined at bedside.  He looks more alert awake.  Sitting on chair comfortable.  Denies nausea vomiting chest pain.  Objective Vital signs in last 24 hours: Vitals:   09/13/19 1420 09/13/19 2133 09/14/19 0506 09/14/19 0955  BP: 91/67 102/63 93/69   Pulse: 91 74 73   Resp: 20 17 17    Temp: (!) 97.1 F (36.2 C) 98.6 F (37 C) 98 F (36.7 C)   TempSrc:  Oral Oral   SpO2: 98% 100% 100% 98%  Weight:      Height:       Weight change:   Intake/Output Summary (Last 24 hours) at 09/14/2019 0959 Last data filed at 09/14/2019 0100 Gross per 24 hour  Intake 700 ml  Output --  Net 700 ml       Labs: Basic Metabolic Panel: Recent Labs  Lab 09/12/19 0742  NA 136  K 5.2*  CL 94*  CO2 31  GLUCOSE 154*  BUN 30*  CREATININE 6.74*  CALCIUM 8.5*   Liver Function Tests: Recent Labs   Lab 09/12/19 0742  AST 51*  ALT 19  ALKPHOS 201*  BILITOT 0.7  PROT 6.6  ALBUMIN 3.2*   Recent Labs  Lab 09/12/19 0742  LIPASE 149*   No results for input(s): AMMONIA in the last 168 hours. CBC: Recent Labs  Lab 09/12/19 0742 09/13/19 0516 09/14/19 0500  WBC 6.0 5.0 5.2  NEUTROABS 3.8  --   --   HGB 7.1* 8.5* 9.2*  HCT 25.1* 28.9* 31.8*  MCV 92.3 88.1 89.8  PLT 223 197 201   Cardiac Enzymes: No results for input(s): CKTOTAL, CKMB, CKMBINDEX, TROPONINI in the last 168 hours. CBG: Recent Labs  Lab 09/13/19 0754 09/13/19 1123 09/13/19 1610 09/13/19 2134 09/14/19 0734  GLUCAP 89 154* 108* 96 74    Iron Studies:  Recent Labs    09/12/19 1745  IRON 15*  TIBC 333  FERRITIN 35   Studies/Results: US Venous Img Lower Unilateral Left (DVT)  Result Date: 09/13/2019 CLINICAL DATA:  LEFT lower extremity pain EXAM: LEFT LOWER EXTREMITY VENOUS DOPPLER ULTRASOUND TECHNIQUE: Gray-scale sonography with compression, as well as color and duplex ultrasound, were performed to evaluate the deep venous system(s) from the level of the common femoral vein through the popliteal and proximal calf veins. COMPARISON:  None FINDINGS: VENOUS  Normal compressibility of the common femoral, superficial femoral, and popliteal veins, as well as the visualized calf veins. Visualized portions of profunda femoral vein and great saphenous vein unremarkable. No filling defects to suggest DVT on grayscale or color Doppler imaging. Doppler waveforms show normal direction of venous flow, normal respiratory phasicity and response to augmentation. Limited views of the contralateral common femoral vein are unremarkable. OTHER None. Limitations: none IMPRESSION: No femoropopliteal DVT nor evidence of DVT within the visualized calf veins in the LEFT leg. If clinical symptoms are inconsistent or if there are persistent or worsening symptoms, further imaging (possibly involving the iliac veins) may be warranted.  Electronically Signed   By: Lavonia Dana M.D.   On: 09/13/2019 14:35   US Paracentesis  Result Date: 09/12/2019 INDICATION: Ascites EXAM: ULTRASOUND GUIDED PARACENTESIS MEDICATIONS: 10 cc 1% lidocaine COMPLICATIONS: None immediate. PROCEDURE: Informed written consent was obtained from the patient after a discussion of the risks, benefits and alternatives to treatment. A timeout was performed prior to the initiation of the procedure. Initial ultrasound scanning demonstrates a large amount of ascites within the right lower abdominal quadrant. The right lower abdomen was prepped and draped in the usual sterile fashion. 1% lidocaine was used for local anesthesia. Following this, a 48 G Yueh catheter was introduced. An ultrasound image was saved for documentation purposes. The paracentesis was performed. The catheter was removed and a dressing was applied. The patient tolerated the procedure well without immediate post procedural complication. Patient received post-procedure intravenous albumin; see nursing notes for details. FINDINGS: A total of approximately 3 liters of blood tinged fluid was removed. IMPRESSION: Successful ultrasound-guided paracentesis yielding 3 liters of peritoneal fluid. Read by Lavonia Drafts [facility] Electronically Signed   By: Markus Daft M.D.   On: 09/12/2019 11:24    Medications: Infusions: . sodium chloride    . sodium chloride    . sodium chloride    . ferric gluconate (FERRLECIT/NULECIT) IV Stopped (09/13/19 1428)    Scheduled Medications: . sodium chloride   Intravenous Once  . allopurinol  100 mg Oral Daily  . amiodarone  100 mg Oral BID  . apixaban  2.5 mg Oral BID  . atorvastatin  20 mg Oral q1800  . Chlorhexidine Gluconate Cloth  6 each Topical Q0600  . Chlorhexidine Gluconate Cloth  6 each Topical Q0600  . darbepoetin (ARANESP) injection - DIALYSIS  100 mcg Intravenous Q Wed-HD  . gabapentin  100 mg Oral TID  . insulin aspart  0-5 Units Subcutaneous QHS  . insulin  aspart  0-6 Units Subcutaneous TID WC  . midodrine  10 mg Oral TID WC  . pantoprazole  40 mg Oral Daily  . polyethylene glycol  17 g Oral Daily  . QUEtiapine  25 mg Oral QHS    have reviewed scheduled and prn medications.  Physical Exam: General:NAD, comfortable Heart:RRR, s1s2 nl Lungs:clear b/l, no crackle Abdomen: Distended abdomen, firm Extremities:LE edema+ Dialysis Access: Right IJ TDC.  Crystalmarie Yasin Prasad Junetta Hearn 09/14/2019,9:59 AM  LOS: 1 day  Pager: [phone]

## 2019-09-17 LAB — CULTURE, BLOOD (ROUTINE X 2)
Culture: NO GROWTH
Culture: NO GROWTH
Special Requests: ADEQUATE
Special Requests: ADEQUATE

## 2019-09-19 ENCOUNTER — Other Ambulatory Visit (HOSPITAL_COMMUNITY): Payer: Self-pay | Admitting: Family Medicine

## 2019-09-19 ENCOUNTER — Other Ambulatory Visit: Payer: Self-pay | Admitting: Family Medicine

## 2019-09-19 DIAGNOSIS — K7031 Alcoholic cirrhosis of liver with ascites: Secondary | ICD-10-CM

## 2019-09-27 ENCOUNTER — Ambulatory Visit (HOSPITAL_COMMUNITY)
Admission: RE | Admit: 2019-09-27 | Discharge: 2019-09-27 | Disposition: A | Discharge status: Home / Self Care | Payer: Medicare Other | Source: Ambulatory Visit | Attending: Family Medicine | Admitting: Family Medicine

## 2019-09-27 ENCOUNTER — Other Ambulatory Visit: Payer: Self-pay

## 2019-09-27 ENCOUNTER — Encounter (HOSPITAL_COMMUNITY): Payer: Self-pay

## 2019-09-27 DIAGNOSIS — K7031 Alcoholic cirrhosis of liver with ascites: Secondary | ICD-10-CM | POA: Diagnosis present

## 2019-09-27 NOTE — Procedures (Signed)
PreOperative Dx: Alcoholic cirrhosis, ascites Postoperative Dx: Alcoholic cirrhosis, ascites Procedure:   US guided paracentesis Radiologist:  Thornton Papas Anesthesia:  10 ml of1% lidocaine Specimen:  5.7 L of serosanguinous ascitic fluid EBL:   < 1 ml Complications: None

## 2019-09-27 NOTE — Progress Notes (Signed)
Paracentesis complete no signs of distress.  

## 2019-09-28 ENCOUNTER — Other Ambulatory Visit: Payer: Self-pay | Admitting: Family Medicine

## 2019-09-28 ENCOUNTER — Other Ambulatory Visit (HOSPITAL_COMMUNITY): Payer: Self-pay | Admitting: Family Medicine

## 2019-09-28 ENCOUNTER — Other Ambulatory Visit: Payer: Self-pay | Admitting: Internal Medicine

## 2019-09-28 DIAGNOSIS — K7031 Alcoholic cirrhosis of liver with ascites: Secondary | ICD-10-CM

## 2019-10-02 ENCOUNTER — Ambulatory Visit (HOSPITAL_COMMUNITY): Admission: RE | Admit: 2019-10-02 | Payer: Medicare Other | Source: Ambulatory Visit

## 2019-10-03 ENCOUNTER — Ambulatory Visit (HOSPITAL_COMMUNITY)
Admission: RE | Admit: 2019-10-03 | Discharge: 2019-10-03 | Disposition: A | Discharge status: Home / Self Care | Payer: Medicare Other | Source: Ambulatory Visit | Attending: Family Medicine | Admitting: Family Medicine

## 2019-10-03 ENCOUNTER — Other Ambulatory Visit: Payer: Self-pay

## 2019-10-03 ENCOUNTER — Encounter (HOSPITAL_COMMUNITY): Payer: Self-pay

## 2019-10-03 DIAGNOSIS — K7031 Alcoholic cirrhosis of liver with ascites: Secondary | ICD-10-CM

## 2019-10-03 NOTE — Progress Notes (Signed)
Paracentesis complete no signs of distress.  

## 2019-10-03 NOTE — Procedures (Signed)
  Recurrent ascites  US guided RLQ paracentesis  3.1 L dark strawberry colored fluid obtained Tolerated well  No labs per MD  EBL: none

## 2019-10-10 ENCOUNTER — Other Ambulatory Visit (HOSPITAL_COMMUNITY): Payer: Self-pay | Admitting: Internal Medicine

## 2019-10-10 ENCOUNTER — Other Ambulatory Visit: Payer: Self-pay | Admitting: Internal Medicine

## 2019-10-10 DIAGNOSIS — K7460 Unspecified cirrhosis of liver: Secondary | ICD-10-CM

## 2019-10-10 DIAGNOSIS — R188 Other ascites: Secondary | ICD-10-CM

## 2019-10-11 ENCOUNTER — Other Ambulatory Visit: Payer: Self-pay | Admitting: Internal Medicine

## 2019-10-11 ENCOUNTER — Other Ambulatory Visit (HOSPITAL_COMMUNITY): Payer: Self-pay | Admitting: Internal Medicine

## 2019-10-11 DIAGNOSIS — K7460 Unspecified cirrhosis of liver: Secondary | ICD-10-CM

## 2019-10-11 DIAGNOSIS — R188 Other ascites: Secondary | ICD-10-CM

## 2019-10-17 DIAGNOSIS — E440 Moderate protein-calorie malnutrition: Secondary | ICD-10-CM | POA: Insufficient documentation

## 2019-10-18 ENCOUNTER — Other Ambulatory Visit (HOSPITAL_COMMUNITY): Payer: Self-pay | Admitting: Internal Medicine

## 2019-10-18 ENCOUNTER — Ambulatory Visit (HOSPITAL_COMMUNITY)
Admission: RE | Admit: 2019-10-18 | Discharge: 2019-10-18 | Disposition: A | Discharge status: Home / Self Care | Payer: No Typology Code available for payment source | Source: Ambulatory Visit | Attending: Internal Medicine | Admitting: Internal Medicine

## 2019-10-18 ENCOUNTER — Other Ambulatory Visit: Payer: Self-pay

## 2019-10-18 DIAGNOSIS — R188 Other ascites: Secondary | ICD-10-CM | POA: Insufficient documentation

## 2019-10-18 DIAGNOSIS — K7460 Unspecified cirrhosis of liver: Secondary | ICD-10-CM | POA: Insufficient documentation

## 2019-10-25 ENCOUNTER — Ambulatory Visit (HOSPITAL_COMMUNITY): Admission: RE | Admit: 2019-10-25 | Payer: Medicare Other | Source: Ambulatory Visit

## 2019-10-25 ENCOUNTER — Encounter (HOSPITAL_COMMUNITY): Payer: Self-pay

## 2019-11-01 ENCOUNTER — Other Ambulatory Visit (HOSPITAL_COMMUNITY): Payer: Self-pay | Admitting: Internal Medicine

## 2019-11-01 ENCOUNTER — Ambulatory Visit (HOSPITAL_COMMUNITY)
Admission: RE | Admit: 2019-11-01 | Discharge: 2019-11-01 | Disposition: A | Discharge status: Home / Self Care | Payer: No Typology Code available for payment source | Source: Ambulatory Visit | Attending: Internal Medicine | Admitting: Internal Medicine

## 2019-11-01 ENCOUNTER — Other Ambulatory Visit: Payer: Self-pay

## 2019-11-01 DIAGNOSIS — K7460 Unspecified cirrhosis of liver: Secondary | ICD-10-CM

## 2019-11-01 DIAGNOSIS — R188 Other ascites: Secondary | ICD-10-CM | POA: Diagnosis present

## 2019-11-08 ENCOUNTER — Ambulatory Visit (HOSPITAL_COMMUNITY): Payer: Medicare Other

## 2019-11-15 ENCOUNTER — Other Ambulatory Visit (HOSPITAL_COMMUNITY): Payer: Self-pay | Admitting: Internal Medicine

## 2019-11-15 ENCOUNTER — Other Ambulatory Visit: Payer: Self-pay

## 2019-11-15 ENCOUNTER — Ambulatory Visit (HOSPITAL_COMMUNITY)
Admission: RE | Admit: 2019-11-15 | Discharge: 2019-11-15 | Disposition: A | Discharge status: Home / Self Care | Payer: Medicare Other | Source: Ambulatory Visit | Attending: Internal Medicine | Admitting: Internal Medicine

## 2019-11-15 DIAGNOSIS — R188 Other ascites: Secondary | ICD-10-CM | POA: Diagnosis present

## 2019-11-15 DIAGNOSIS — K7460 Unspecified cirrhosis of liver: Secondary | ICD-10-CM | POA: Insufficient documentation

## 2019-11-27 ENCOUNTER — Ambulatory Visit (INDEPENDENT_AMBULATORY_CARE_PROVIDER_SITE_OTHER): Payer: Medicare Other | Admitting: Podiatry

## 2019-11-27 ENCOUNTER — Other Ambulatory Visit: Payer: Self-pay

## 2019-11-27 DIAGNOSIS — M79674 Pain in right toe(s): Secondary | ICD-10-CM | POA: Diagnosis not present

## 2019-11-27 DIAGNOSIS — L600 Ingrowing nail: Secondary | ICD-10-CM

## 2019-11-28 ENCOUNTER — Encounter: Payer: Self-pay | Admitting: Podiatry

## 2019-11-28 NOTE — Progress Notes (Signed)
Subjective:  Patient ID: Andre Smith, male    DOB: [birth year],  MRN: [id number]  Chief Complaint  Patient presents with  . Nail Problem    Patient presents today for painful ingrown toenail medial border of right hallux x 3-4 months.  . Diabetes    64 y.o. male presents with the above complaint.  Patient presents with right hallux medial ingrown nail border that has been hurting for quite some time.  Patient states the pain is dull achy in nature.  He would like to have removed.  He has a history of removing long time ago.  Is painful when ambulating and painful in shoes.  He denies any other acute complaints.  He has not seen anyone else prior to seeing me.   Review of Systems: Negative except as noted in the HPI. Denies N/V/F/Ch.  Past Medical History:  Diagnosis Date  . A-fib ([place])   . Anemia   . Cancer ([place])    lung  . Cardiomyopathy ([place])   . CHF (congestive heart failure) ([place])   . Cirrhosis of liver with ascites ([place])   . COPD (chronic obstructive pulmonary disease) ([place])   . Dementia ([place])   . Diabetes mellitus without complication ([place])   . Dysphagia   . GERD (gastroesophageal reflux disease)   . Gout   . Hemoptysis   . Hyperlipemia   . Pulmonary hypertension ([place])   . PVD (peripheral vascular disease) ([place])   . Renal disorder     Current Outpatient Medications:  Marland Kitchen  Methoxy PEG-Epoetin Beta (MIRCERA IJ), Mircera, Disp: , Rfl:  .  albuterol (VENTOLIN HFA) 108 (90 Base) MCG/ACT inhaler, Inhale 2 puffs into the lungs every 4 (four) hours as needed., Disp: , Rfl:  .  allopurinol (ZYLOPRIM) 100 MG tablet, Take 1 tablet (100 mg total) by mouth daily., Disp:  , Rfl:  .  amiodarone (PACERONE) 100 MG tablet, Take 1 tablet by mouth 2 (two) times daily., Disp: , Rfl:  .  apixaban (ELIQUIS) 2.5 MG TABS tablet, Take 1 tablet (2.5 mg total) by mouth 2 (two) times daily., Disp: 60 tablet, Rfl:  .  atorvastatin (LIPITOR) 20 MG tablet, Take 20 mg by mouth at bedtime. , Disp: ,  Rfl:  .  ferrous sulfate 325 (65 FE) MG tablet, Take 1 tablet (325 mg total) by mouth daily with breakfast., Disp: , Rfl: 3 .  gabapentin (NEURONTIN) 300 MG capsule, Take 300 mg by mouth 3 (three) times daily., Disp: , Rfl:  .  HUMALOG KWIKPEN 100 UNIT/ML KwikPen, Inject 0-10 Units into the skin 4 (four) times daily. Before meals and at bedtime, Disp: , Rfl:  .  midodrine (PROAMATINE) 10 MG tablet, Take 1 tablet (10 mg total) by mouth 3 (three) times daily with meals., Disp:  , Rfl:  .  MIRALAX 17 g packet, Take 1 packet by mouth at bedtime., Disp: , Rfl:  .  omeprazole (PRILOSEC) 40 MG capsule, Take 1 capsule by mouth in the morning., Disp: , Rfl:  .  QUEtiapine (SEROQUEL) 25 MG tablet, Take 25 mg by mouth at bedtime., Disp: , Rfl:  .  sevelamer carbonate (RENVELA) 800 MG tablet, Take 800 mg by mouth 3 (three) times daily., Disp: , Rfl:   Social History   Tobacco Use  Smoking Status Former Smoker    Allergies  Allergen Reactions  . Aspirin     Other reaction(s): increases sugar  . Nickel Rash   Objective:  There were no vitals filed for  this visit. There is no height or weight on file to calculate BMI. Constitutional Well developed. Well nourished.  Vascular Dorsalis pedis pulses palpable bilaterally. Posterior tibial pulses palpable bilaterally. Capillary refill normal to all digits.  No cyanosis or clubbing noted. Pedal hair growth normal.  Neurologic Normal speech. Oriented to person, place, and time. Epicritic sensation to light touch grossly present bilaterally.  Dermatologic Painful ingrowing nail at medial nail borders of the hallux nail right. No other open wounds. No skin lesions.  Orthopedic: Normal joint ROM without pain or crepitus bilaterally. No visible deformities. No bony tenderness.   Radiographs: None Assessment:   1. Ingrown toenail of right foot   2. Great toe pain, right    Plan:  Patient was evaluated and treated and all questions  answered.  Ingrown Nail, right -Patient elects to proceed with minor surgery to remove ingrown toenail removal today. Consent reviewed and signed by patient. -Ingrown nail excised. See procedure note. -Educated on post-procedure care including soaking. Written instructions provided and reviewed. -Patient to follow up in 2 weeks for nail check.  Procedure: Excision of Ingrown Toenail Location: Right 1st toe medial nail borders. Anesthesia: Lidocaine 1% plain; 1.5 mL and Marcaine 0.5% plain; 1.5 mL, digital block. Skin Prep: Betadine. Dressing: Silvadene; telfa; dry, sterile, compression dressing. Technique: Following skin prep, the toe was exsanguinated and a tourniquet was secured at the base of the toe. The affected nail border was freed, split with a nail splitter, and excised. Chemical matrixectomy was then performed with phenol and irrigated out with alcohol. The tourniquet was then removed and sterile dressing applied. Disposition: Patient tolerated procedure well. Patient to return in 2 weeks for follow-up.   No follow-ups on file.

## 2019-11-29 ENCOUNTER — Other Ambulatory Visit (HOSPITAL_COMMUNITY): Payer: Self-pay | Admitting: Internal Medicine

## 2019-11-29 ENCOUNTER — Ambulatory Visit (HOSPITAL_COMMUNITY)
Admission: RE | Admit: 2019-11-29 | Discharge: 2019-11-29 | Disposition: A | Discharge status: Home / Self Care | Payer: Medicare Other | Source: Ambulatory Visit | Attending: Internal Medicine | Admitting: Internal Medicine

## 2019-11-29 ENCOUNTER — Other Ambulatory Visit: Payer: Self-pay

## 2019-11-29 ENCOUNTER — Ambulatory Visit (HOSPITAL_COMMUNITY): Admission: RE | Admit: 2019-11-29 | Payer: Medicare Other | Source: Ambulatory Visit

## 2019-11-29 ENCOUNTER — Ambulatory Visit (HOSPITAL_COMMUNITY): Payer: Medicare Other

## 2019-11-29 DIAGNOSIS — K7460 Unspecified cirrhosis of liver: Secondary | ICD-10-CM | POA: Diagnosis present

## 2019-11-29 DIAGNOSIS — R188 Other ascites: Secondary | ICD-10-CM | POA: Diagnosis present

## 2019-12-03 ENCOUNTER — Other Ambulatory Visit (HOSPITAL_COMMUNITY): Payer: Self-pay | Admitting: Internal Medicine

## 2019-12-03 DIAGNOSIS — K7460 Unspecified cirrhosis of liver: Secondary | ICD-10-CM

## 2019-12-13 ENCOUNTER — Other Ambulatory Visit (HOSPITAL_COMMUNITY): Payer: Self-pay | Admitting: Internal Medicine

## 2019-12-13 ENCOUNTER — Ambulatory Visit (HOSPITAL_COMMUNITY)
Admission: RE | Admit: 2019-12-13 | Discharge: 2019-12-13 | Disposition: A | Discharge status: Home / Self Care | Payer: Medicare Other | Source: Ambulatory Visit | Attending: Internal Medicine | Admitting: Internal Medicine

## 2019-12-13 ENCOUNTER — Other Ambulatory Visit: Payer: Self-pay

## 2019-12-13 DIAGNOSIS — K7460 Unspecified cirrhosis of liver: Secondary | ICD-10-CM | POA: Insufficient documentation

## 2020-11-19 DEATH — deceased

## 2021-08-21 IMAGING — US US EXTREM LOW VENOUS*L*
1 series · 14 of 24 positions shown · non-contrast
Comparison: None

CLINICAL DATA: LEFT lower extremity pain

EXAM:
LEFT LOWER EXTREMITY VENOUS DOPPLER ULTRASOUND
TECHNIQUE: Gray-scale sonography with compression, as well as color and duplex
ultrasound, were performed to evaluate the deep venous system(s)
from the level of the common femoral vein through the popliteal and
proximal calf veins.

[Series 1: us venous img lower uni left (dvt) · portal-venous · 14 of 35 slices shown]
[im 1/35]
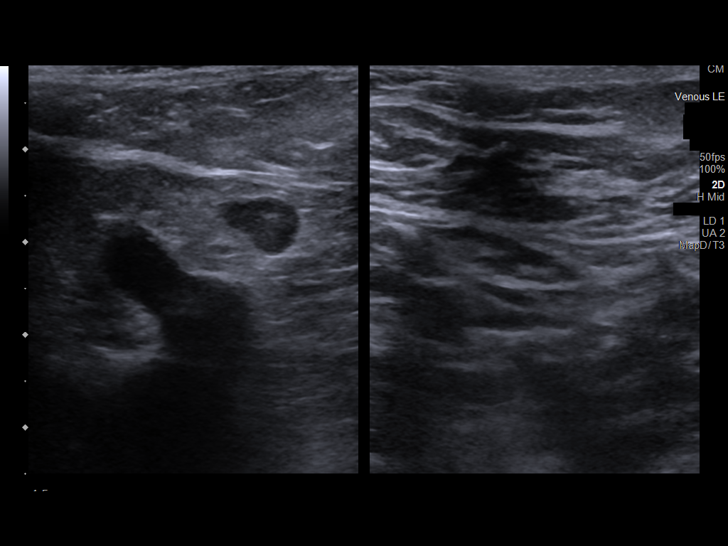
[im 3/35]
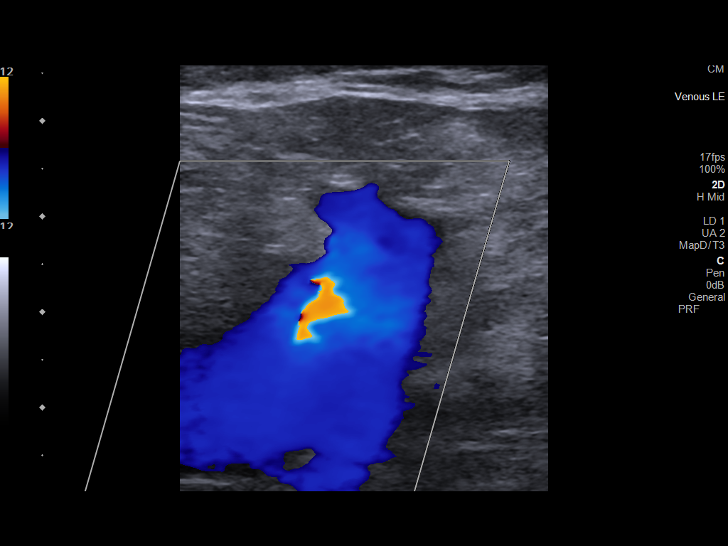
[im 6/35]
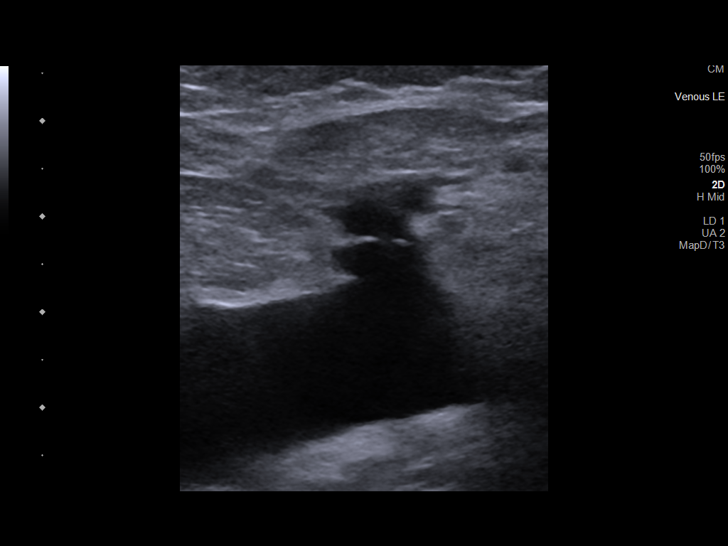
[im 9/35]
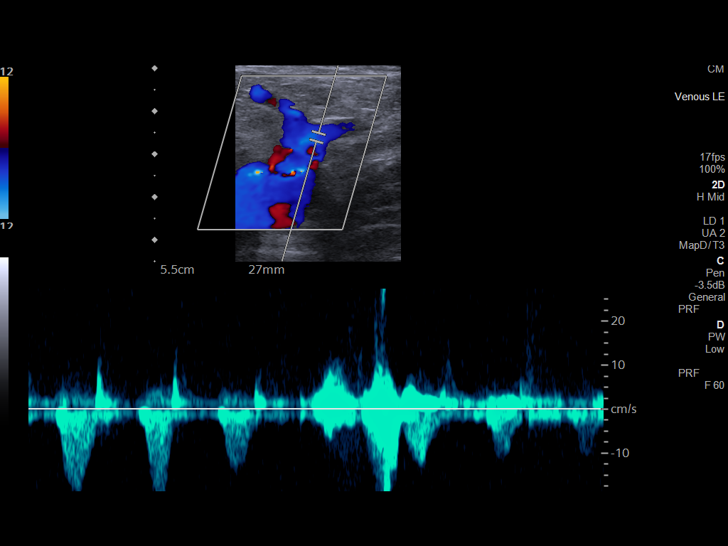
[im 11/35]
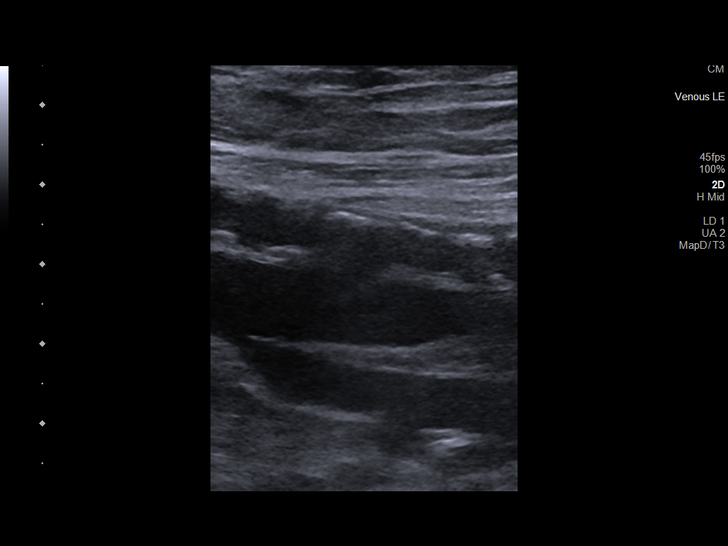
[im 14/35]
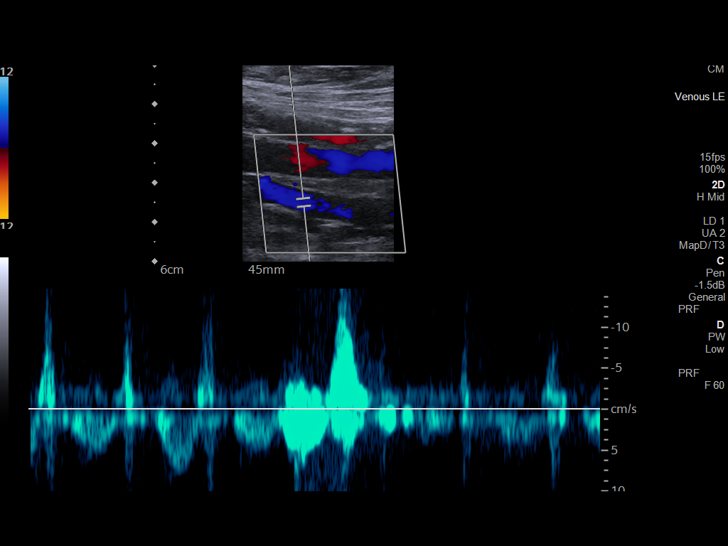
[im 17/35]
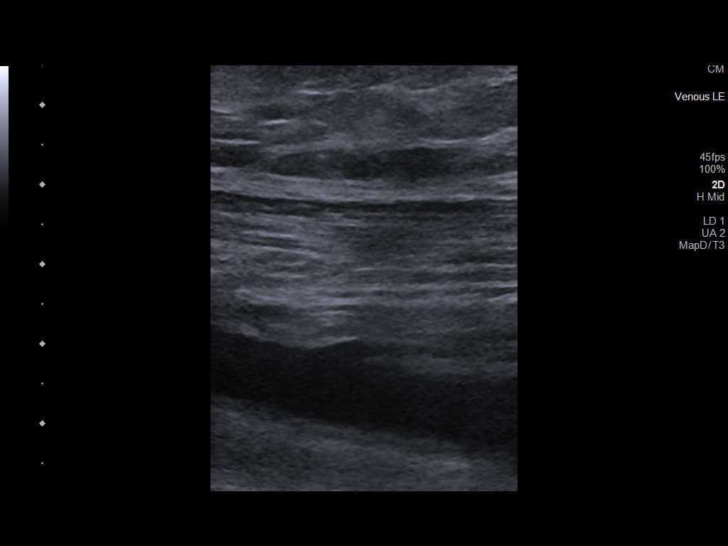
[im 18/35]
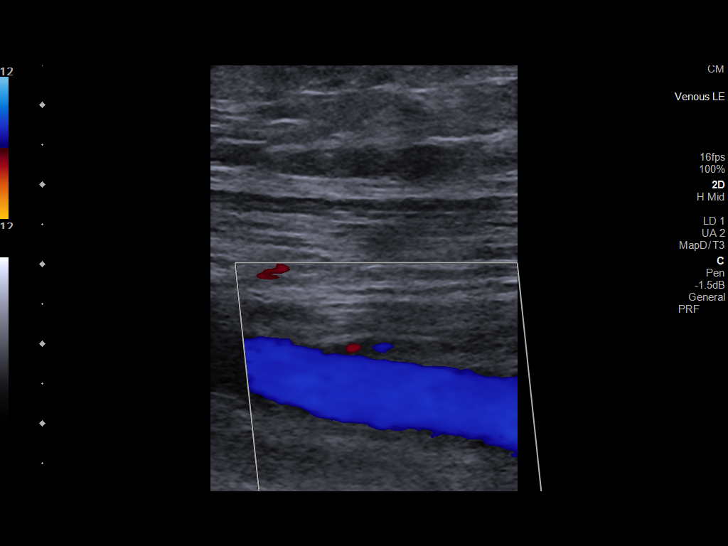
[im 21/35]
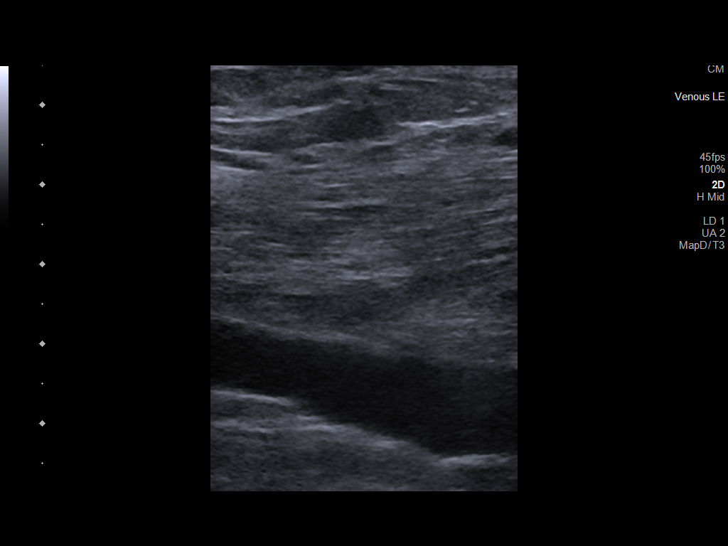
[im 24/35]
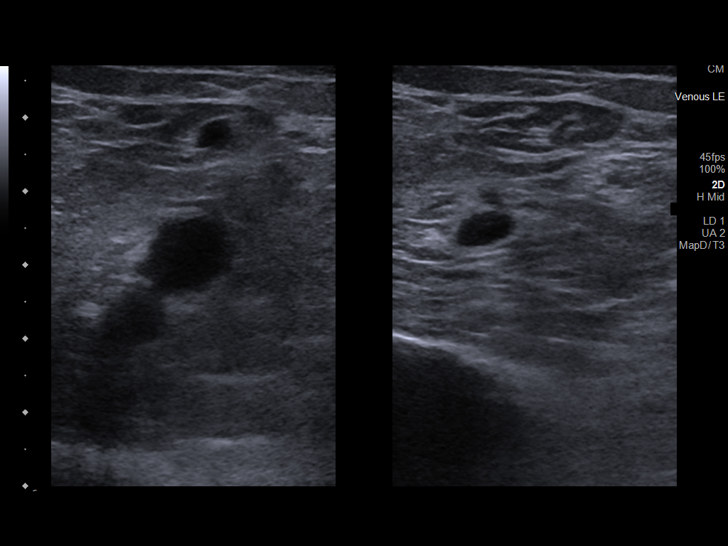
[im 27/35]
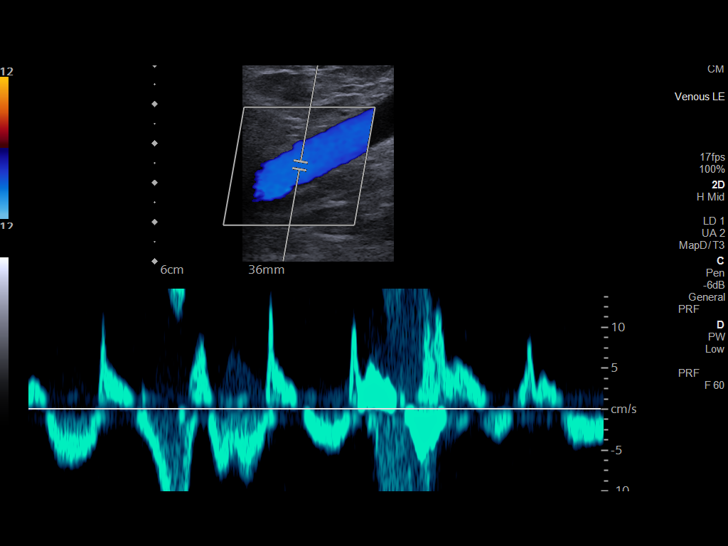
[im 29/35]
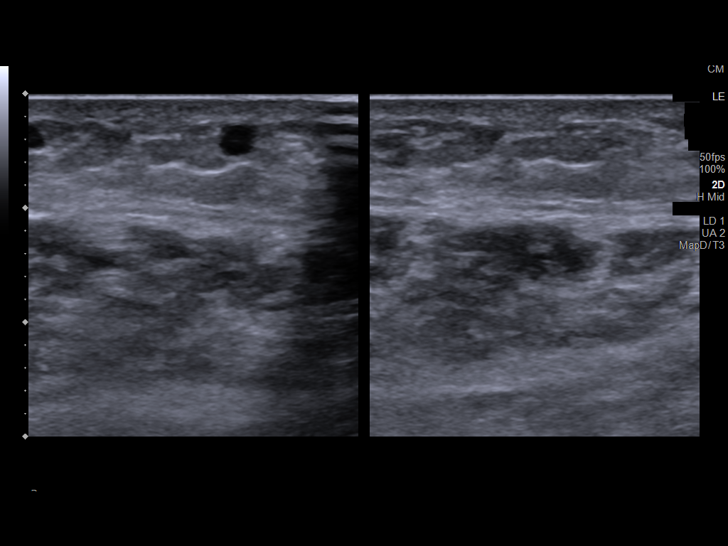
[im 32/35]
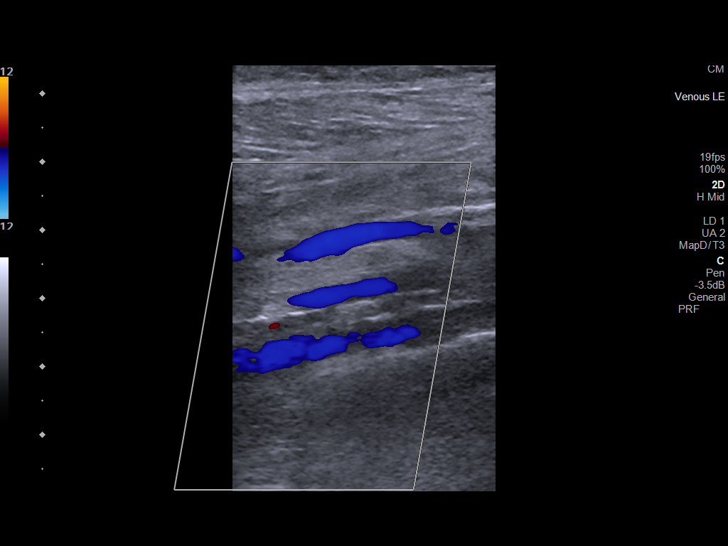
[im 35/35]
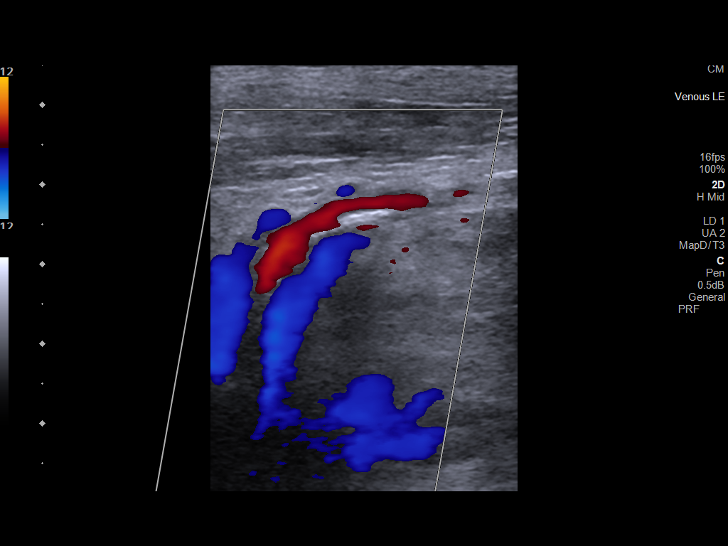

[14 of 24 positions shown; findings below may reference images not displayed]

FINDINGS: VENOUS

Normal compressibility of the common femoral, superficial femoral,
and popliteal veins, as well as the visualized calf veins.
Visualized portions of profunda femoral vein and great saphenous
vein unremarkable. No filling defects to suggest DVT on grayscale or
color Doppler imaging. Doppler waveforms show normal direction of
venous flow, normal respiratory phasicity and response to
augmentation.

Limited views of the contralateral common femoral vein are
unremarkable.

OTHER

None.

Limitations: none
IMPRESSION: No femoropopliteal DVT nor evidence of DVT within the visualized
calf veins in the LEFT leg.

If clinical symptoms are inconsistent or if there are persistent or
worsening symptoms, further imaging (possibly involving the iliac
veins) may be warranted.

## 2021-09-04 IMAGING — US US PARACENTESIS
1 series · 4 of 4 positions shown · non-contrast
Comparison: none

INDICATION: Alcoholic cirrhosis, ascites

[Series 1: us paracentesis · 4 of 4 slices shown]
[im 1/4]
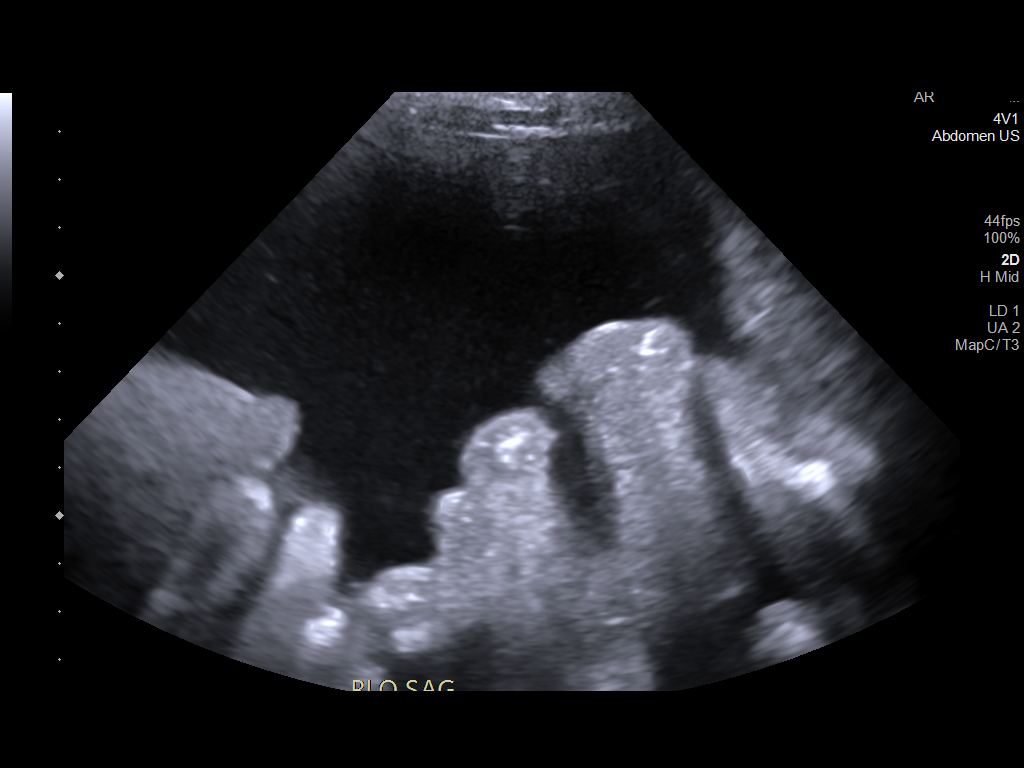
[im 2/4]
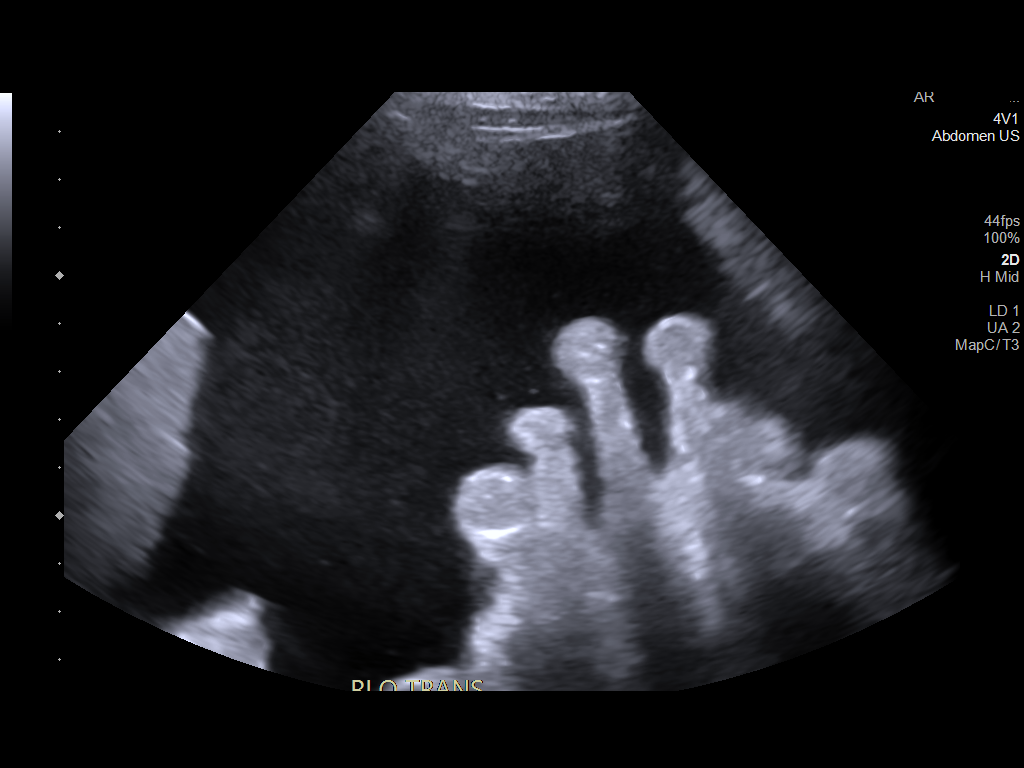
[im 3/4]
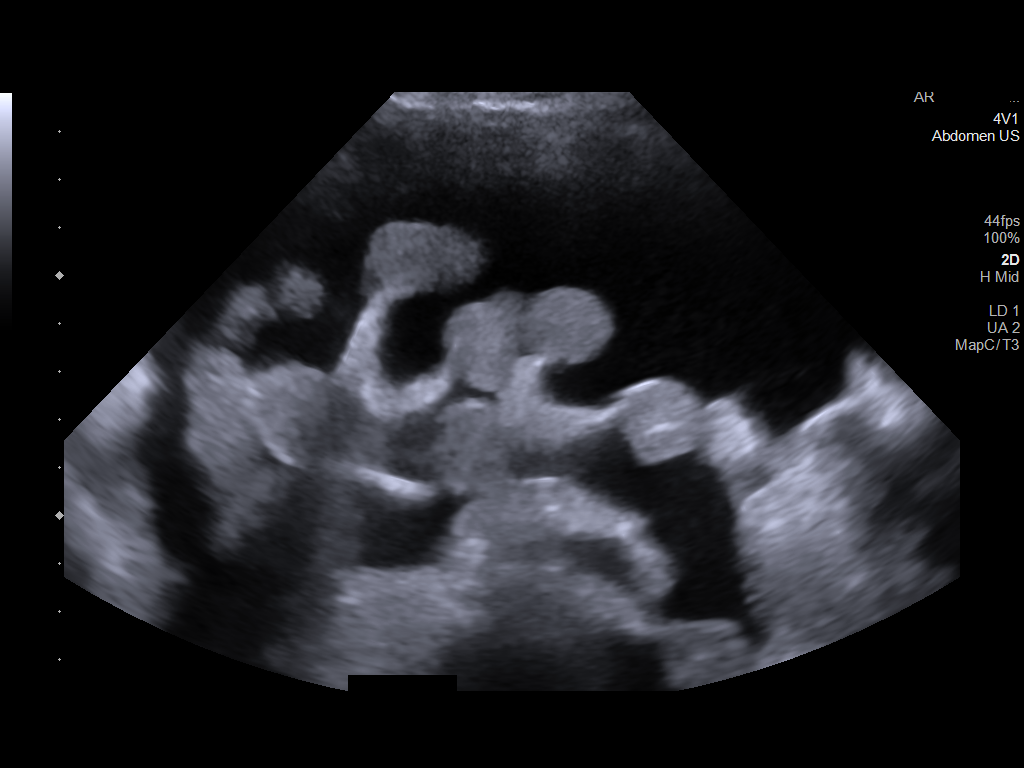
[im 4/4]
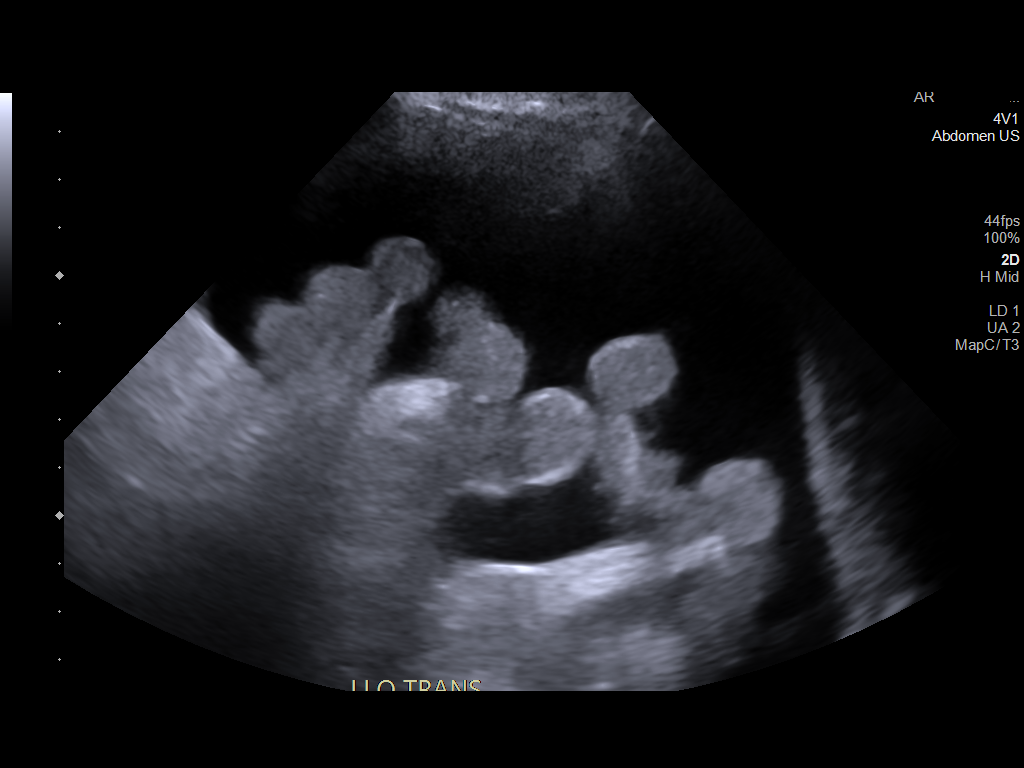

[4 of 4 positions shown; findings below may reference images not displayed]

EXAM:
ULTRASOUND GUIDED THERAPEUTIC PARACENTESIS

MEDICATIONS:
None

COMPLICATIONS:
None immediate

PROCEDURE:
Informed written consent was obtained from the patient after a
discussion of the risks, benefits and alternatives to treatment. A
timeout was performed prior to the initiation of the procedure.

Initial ultrasound scanning demonstrates a large amount of ascites
within the right lower abdominal quadrant. The right lower abdomen
was prepped and draped in the usual sterile fashion. 1% lidocaine
was used for local anesthesia.

Following this, a 5 French Yueh catheter was introduced. An
ultrasound image was saved for documentation purposes. The
paracentesis was performed. The catheter was removed and a dressing
was applied. The patient tolerated the procedure well without
immediate post procedural complication.
Patient received post-procedure intravenous albumin; see nursing
notes for details.
FINDINGS: A total of approximately 5.7 L of serosanguineous fluid was removed.
IMPRESSION: Successful ultrasound-guided paracentesis yielding 5.7 liters of
peritoneal fluid.

## 2021-09-10 IMAGING — US US PARACENTESIS
1 series · 3 of 3 positions shown · non-contrast
Comparison: none

INDICATION: Recurrent ascites

[Series 1: us paracentesis · 3 of 3 slices shown]
[im 1/3]
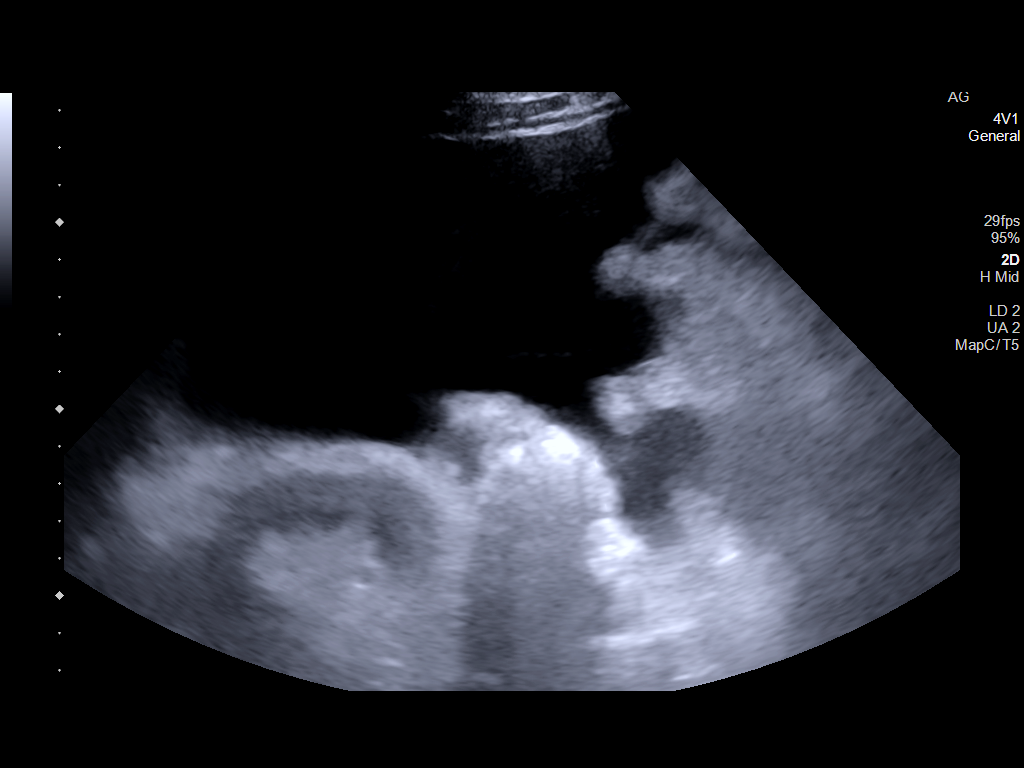
[im 2/3]
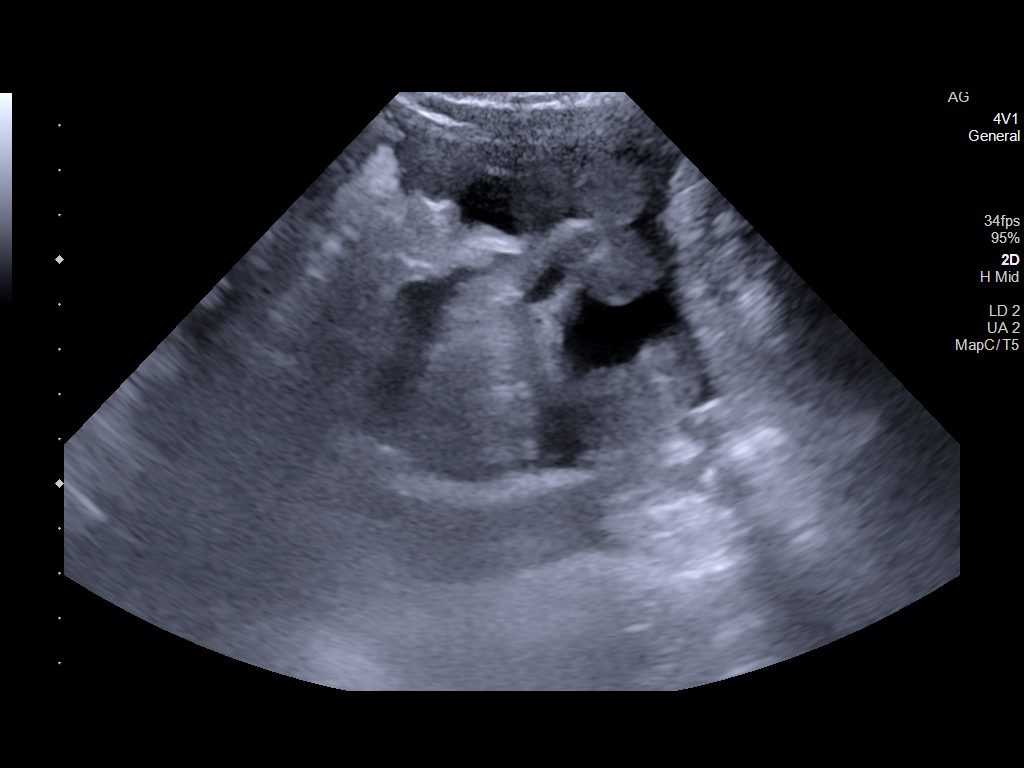
[im 3/3]
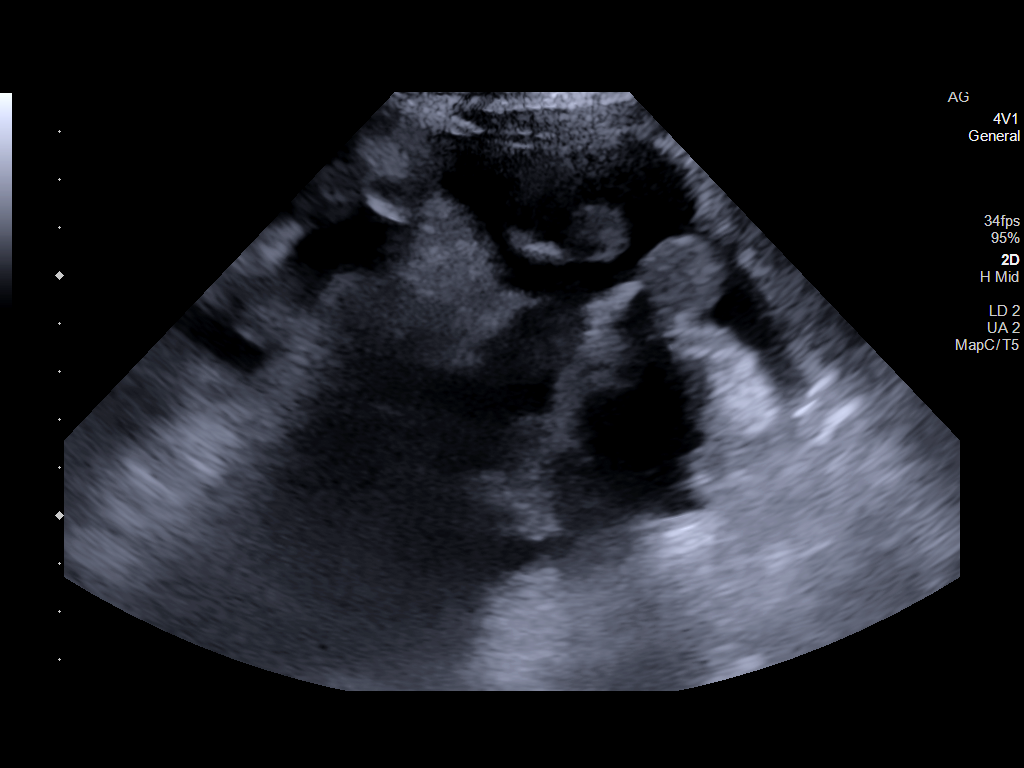

[3 of 3 positions shown; findings below may reference images not displayed]

Alcoholic cirrhosis

EXAM:
ULTRASOUND GUIDED RLQ PARACENTESIS

MEDICATIONS:
10 cc 1% lidocaine

COMPLICATIONS:
None immediate.

PROCEDURE:
Informed written consent was obtained from the patient after a
discussion of the risks, benefits and alternatives to treatment. A
timeout was performed prior to the initiation of the procedure.

Initial ultrasound scanning demonstrates a large amount of ascites
within the right lower abdominal quadrant. The right lower abdomen
was prepped and draped in the usual sterile fashion. 1% lidocaine
was used for local anesthesia.

Following this, a 19 G Yueh catheter was introduced. An ultrasound
image was saved for documentation purposes. The paracentesis was
performed. The catheter was removed and a dressing was applied. The
patient tolerated the procedure well without immediate post
procedural complication.
Patient received post-procedure intravenous albumin; see nursing
notes for details.
FINDINGS: A total of approximately 3.1 liters of dark strawberry fluid was
removed.
IMPRESSION: Successful ultrasound-guided paracentesis yielding 3.1 liters liters
of peritoneal fluid.

Read by

Salo Rm

## 2021-09-25 IMAGING — US US ABDOMEN LIMITED
1 series · 4 of 4 positions shown · non-contrast
Comparison: 10/03/2019

CLINICAL DATA: Cirrhosis, ascites

EXAM:
LIMITED ABDOMEN ULTRASOUND FOR ASCITES
TECHNIQUE: Limited ultrasound survey for ascites was performed in all four
abdominal quadrants.

[Series 1: us abdomen limited · 4 of 4 slices shown]
[im 1/4]
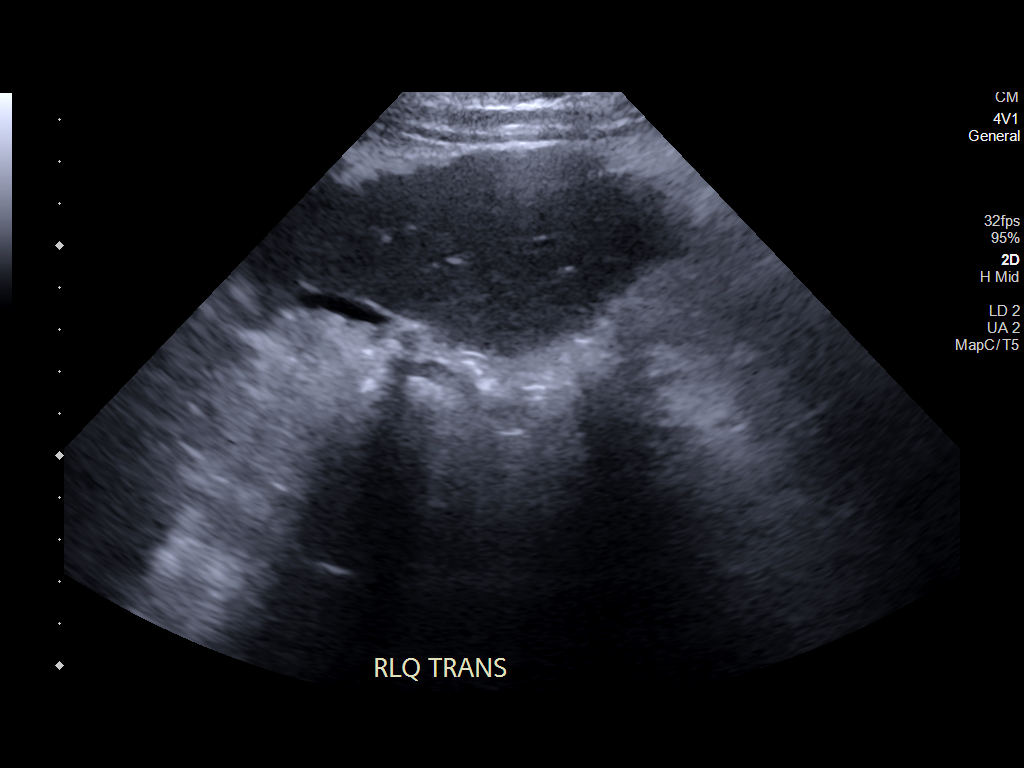
[im 2/4]
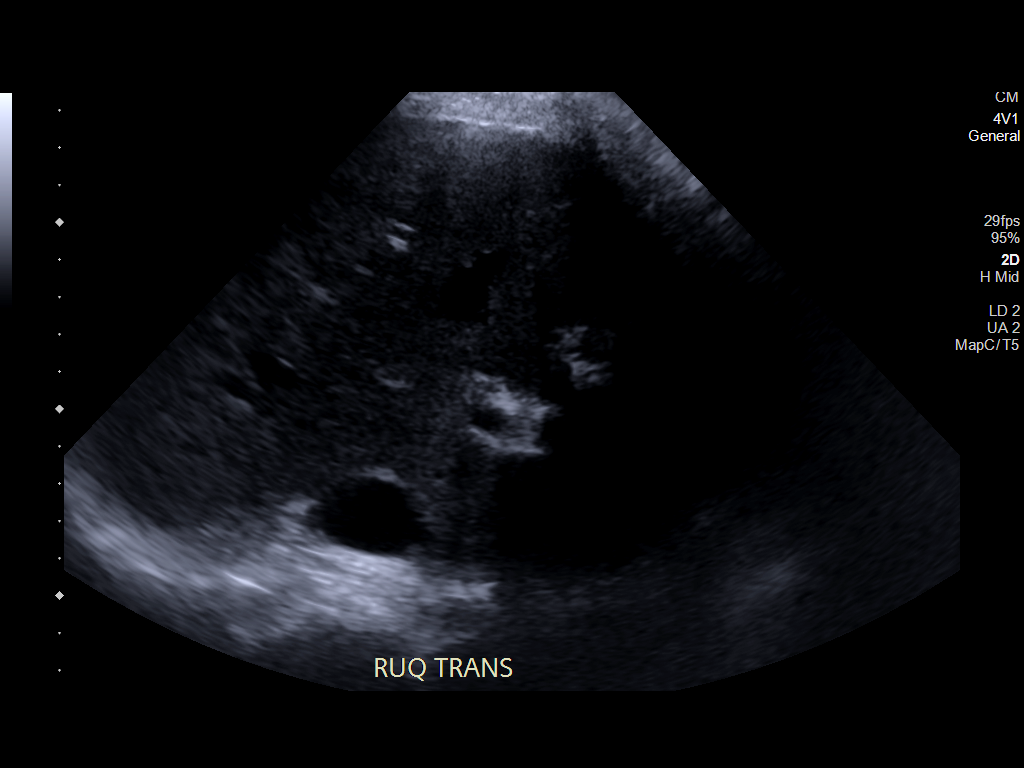
[im 3/4]
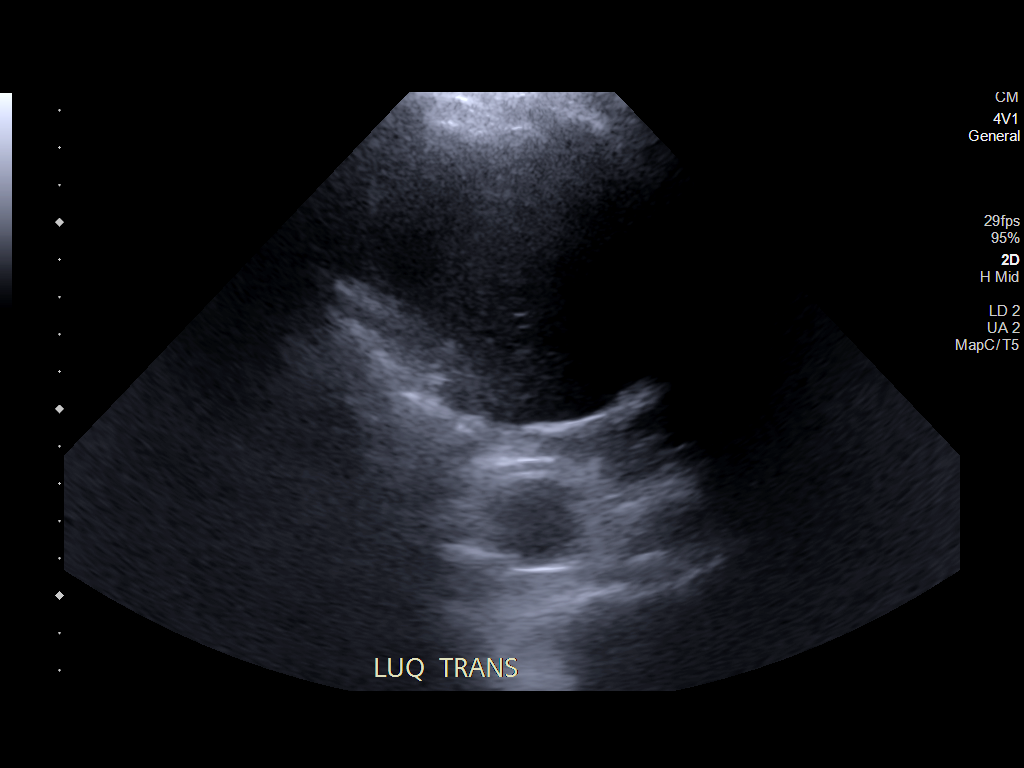
[im 4/4]
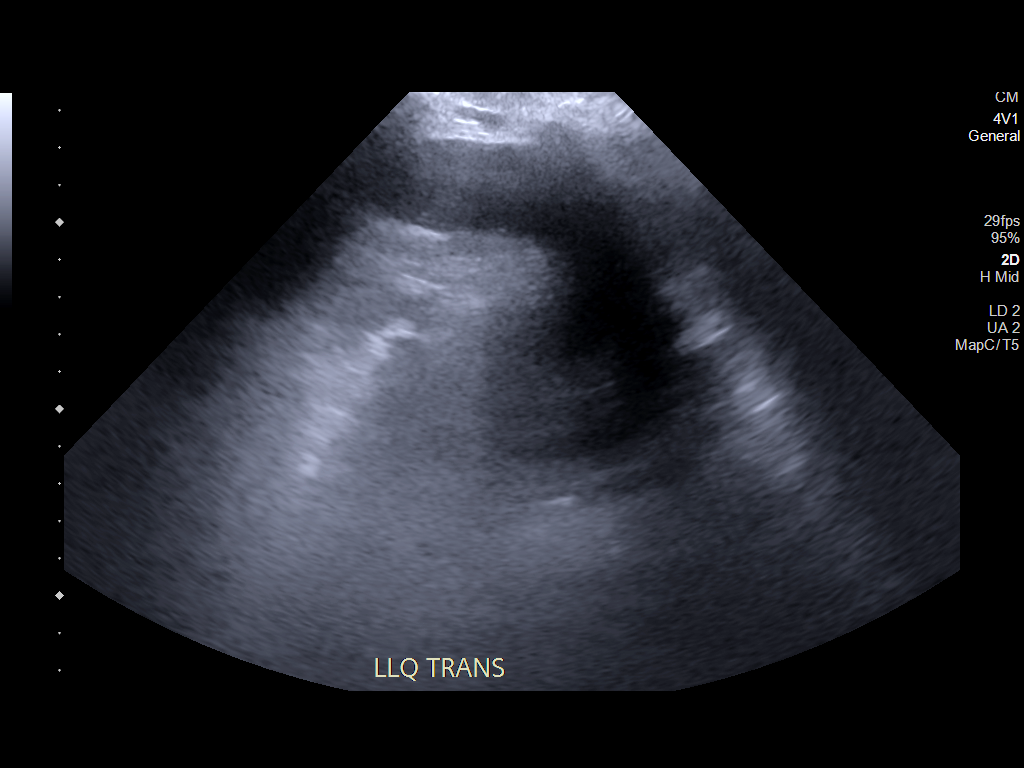

[4 of 4 positions shown; findings below may reference images not displayed]

FINDINGS: Cirrhotic appearing liver.

No ascites identified upon survey imaging of the 4 quadrants.
IMPRESSION: No ascites.

## 2021-10-23 IMAGING — US US ABDOMEN LIMITED
1 series · 4 of 4 positions shown · non-contrast
Comparison: 11/01/2019

CLINICAL DATA: Cirrhosis, ascites, for paracentesis

EXAM:
LIMITED ABDOMEN ULTRASOUND FOR ASCITES
TECHNIQUE: Limited ultrasound survey for ascites was performed in all four
abdominal quadrants.

[Series 1: us paracentesis · 4 of 4 slices shown]
[im 1/4]
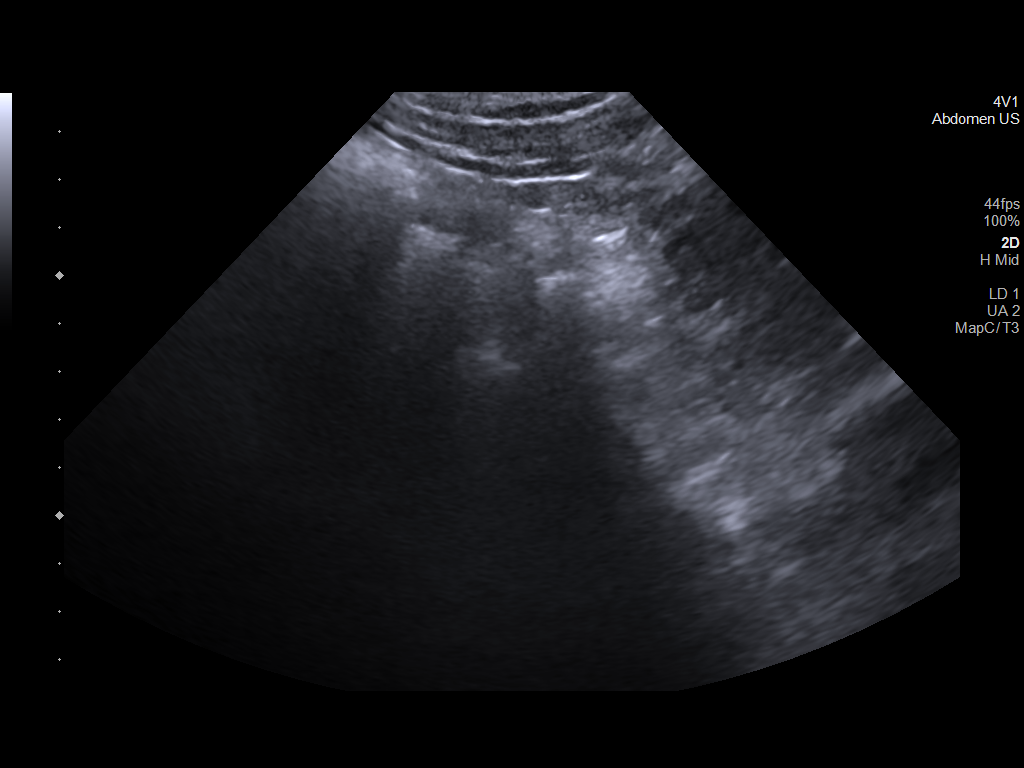
[im 2/4]
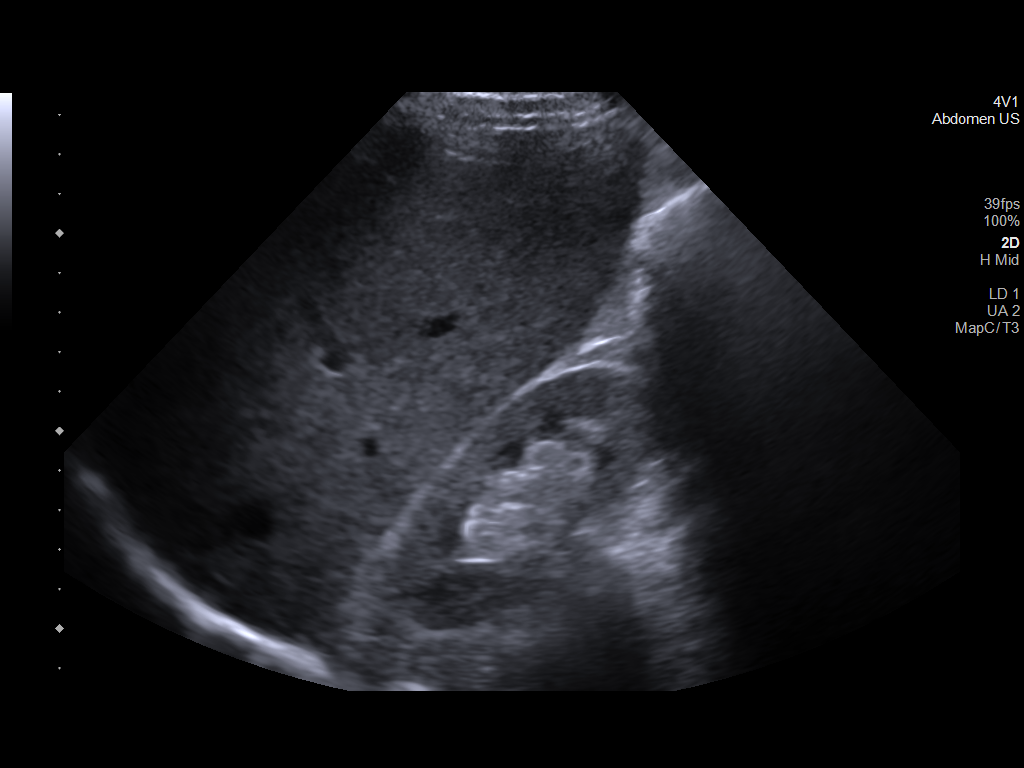
[im 3/4]
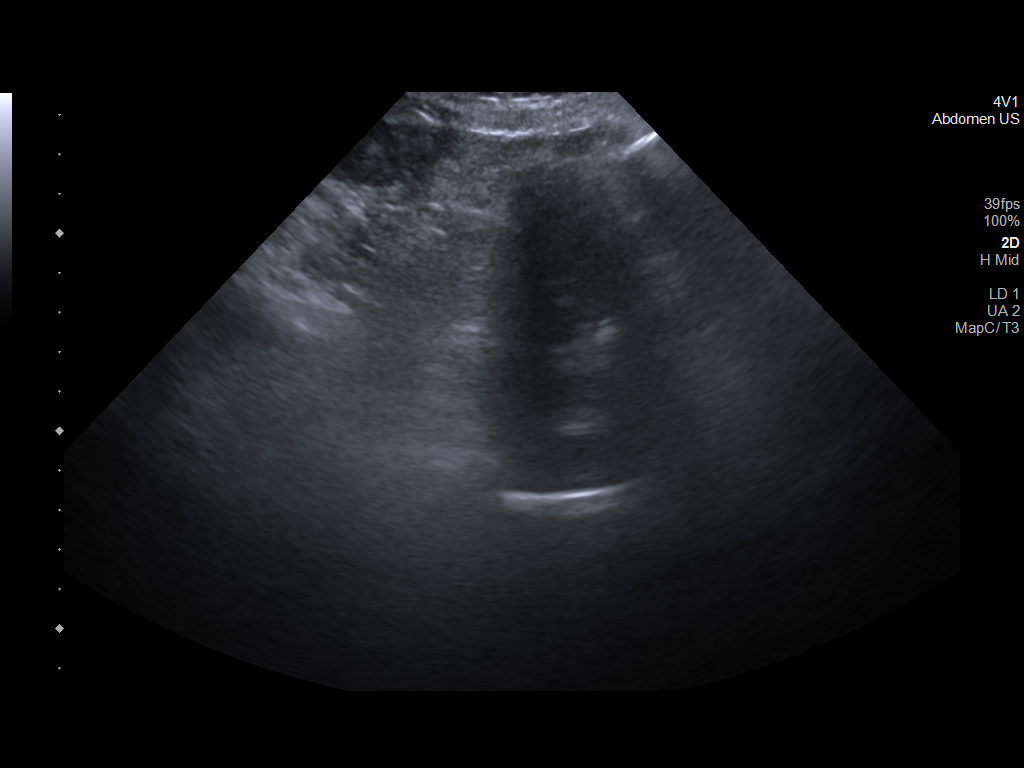
[im 4/4]
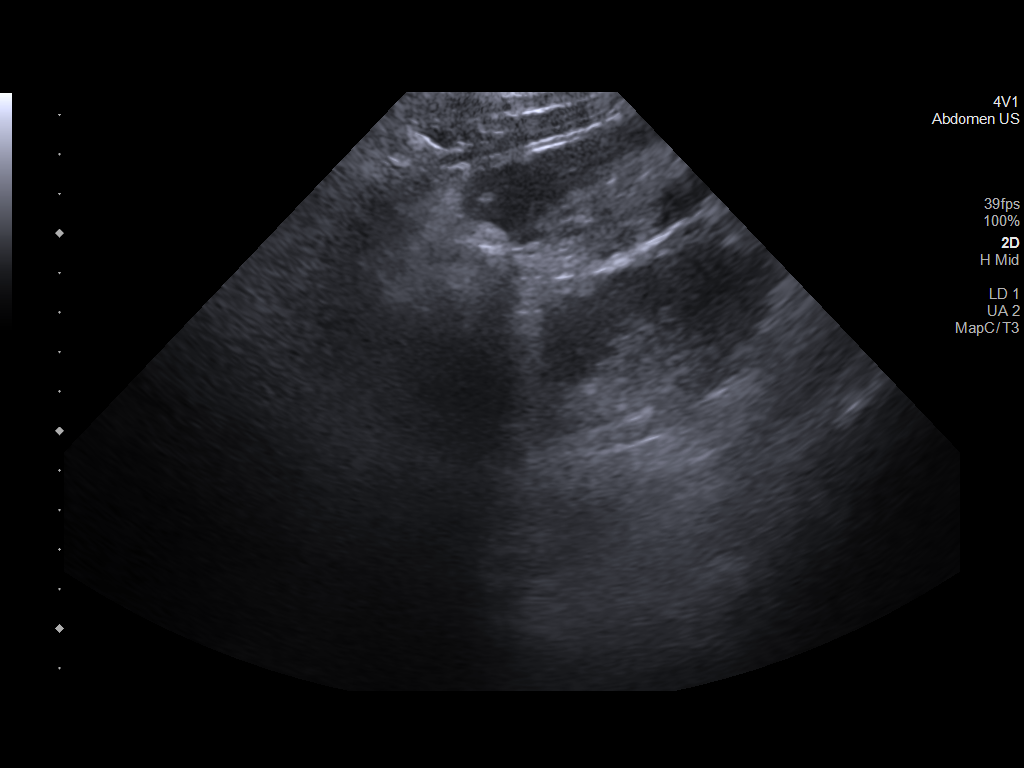

[4 of 4 positions shown; findings below may reference images not displayed]

FINDINGS: Survey imaging of the abdomen shows no evidence of ascites.
IMPRESSION: No ascites.
